# Patient Record
Sex: Female | Born: 1940 | Race: White | Hispanic: No | Smoking: Never smoker
Health system: Southern US, Community
[De-identification: ages and names within clinical notes are randomized; demographics above are authoritative.]

## PROBLEM LIST (undated history)

## (undated) DIAGNOSIS — I10 Essential (primary) hypertension: Secondary | ICD-10-CM

## (undated) HISTORY — PX: HAND SURGERY: SHX662

---

## 1998-02-08 ENCOUNTER — Emergency Department (HOSPITAL_COMMUNITY): Admission: EM | Admit: 1998-02-08 | Discharge: 1998-02-08 | Payer: Self-pay | Admitting: Emergency Medicine

## 1999-12-28 ENCOUNTER — Encounter: Payer: Self-pay | Admitting: Emergency Medicine

## 1999-12-28 ENCOUNTER — Emergency Department (HOSPITAL_COMMUNITY): Admission: EM | Admit: 1999-12-28 | Discharge: 1999-12-28 | Payer: Self-pay | Admitting: Emergency Medicine

## 2005-08-30 ENCOUNTER — Encounter: Payer: Self-pay | Admitting: Emergency Medicine

## 2007-07-13 ENCOUNTER — Emergency Department (HOSPITAL_COMMUNITY): Admission: EM | Admit: 2007-07-13 | Discharge: 2007-07-13 | Payer: Self-pay | Admitting: Emergency Medicine

## 2007-07-26 ENCOUNTER — Encounter: Admission: RE | Admit: 2007-07-26 | Discharge: 2007-07-26 | Payer: Self-pay | Admitting: General Surgery

## 2007-08-20 ENCOUNTER — Encounter: Admission: RE | Admit: 2007-08-20 | Discharge: 2007-08-20 | Payer: Self-pay | Admitting: General Surgery

## 2007-08-23 ENCOUNTER — Emergency Department (HOSPITAL_COMMUNITY): Admission: EM | Admit: 2007-08-23 | Discharge: 2007-08-23 | Payer: Self-pay | Admitting: Emergency Medicine

## 2007-09-03 ENCOUNTER — Encounter: Admission: RE | Admit: 2007-09-03 | Discharge: 2007-09-03 | Payer: Self-pay | Admitting: General Surgery

## 2008-07-02 IMAGING — CR DG HAND COMPLETE 3+V*R*
4 series · 4 of 4 positions shown · non-contrast
Comparison: 07/13/2007, 07/26/2007.

CLINICAL DATA: Fifth metacarpal fracture, follow-up exam

RIGHT HAND - COMPLETE 3+ VIEW

[x hand pa right (1 of 2)]
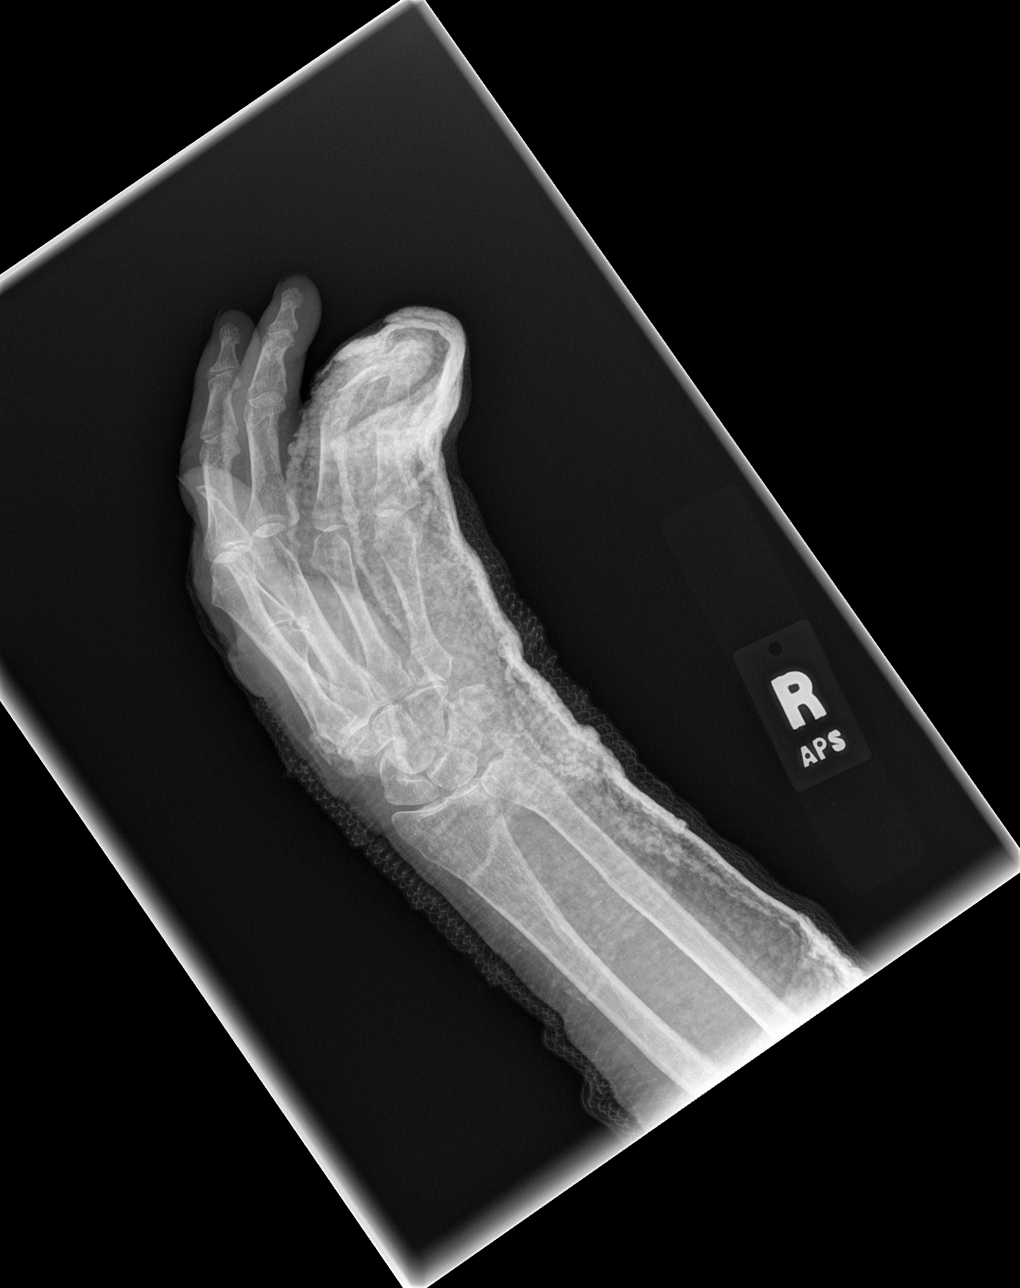

[x hand oblique right]
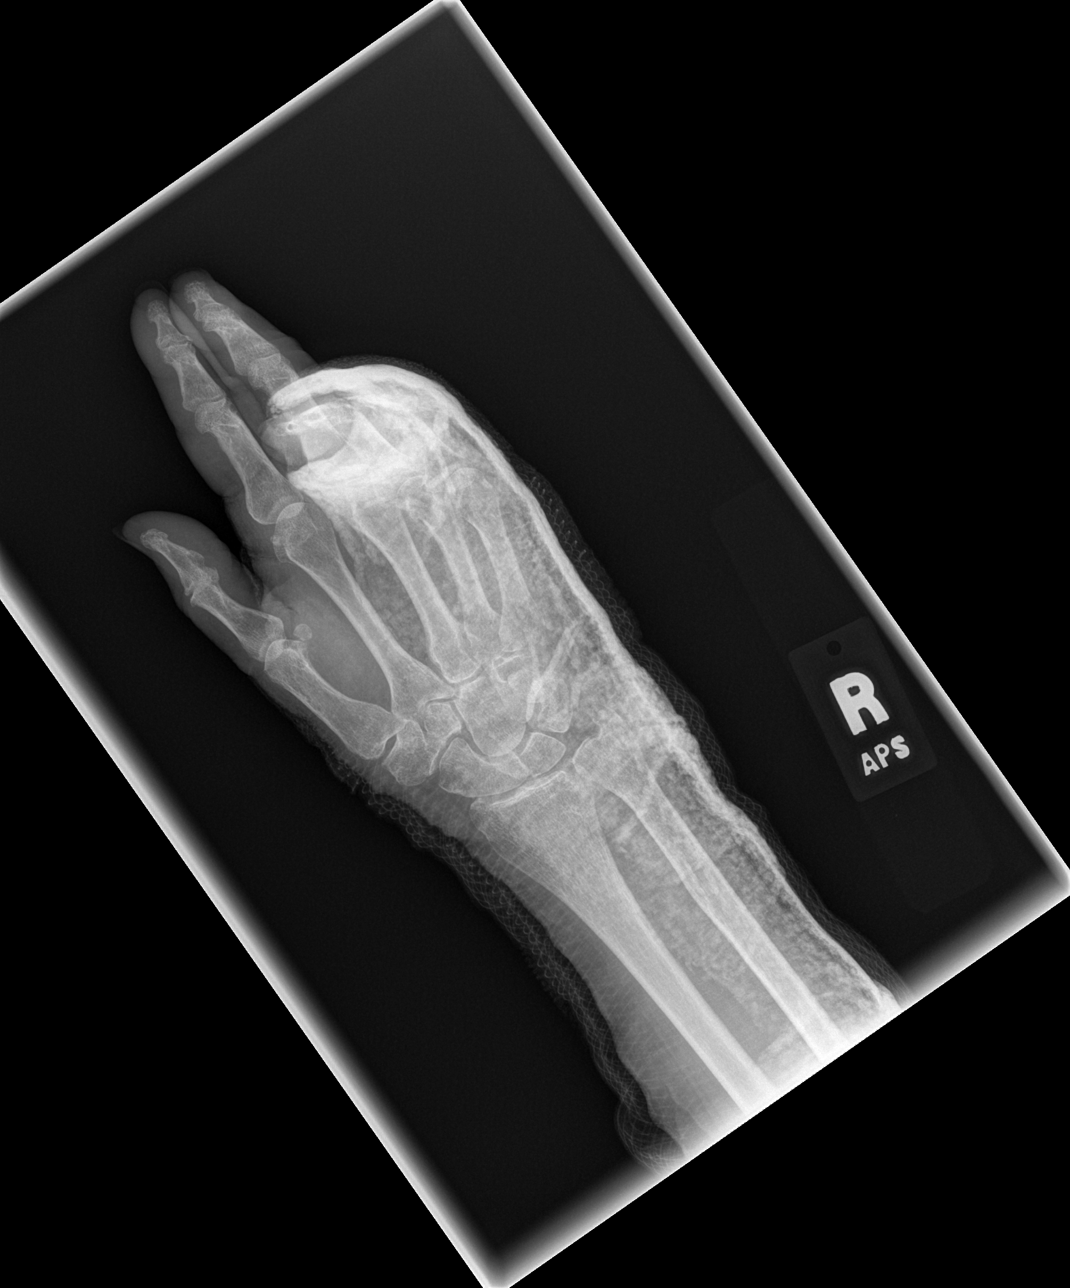

[x hand lat right]
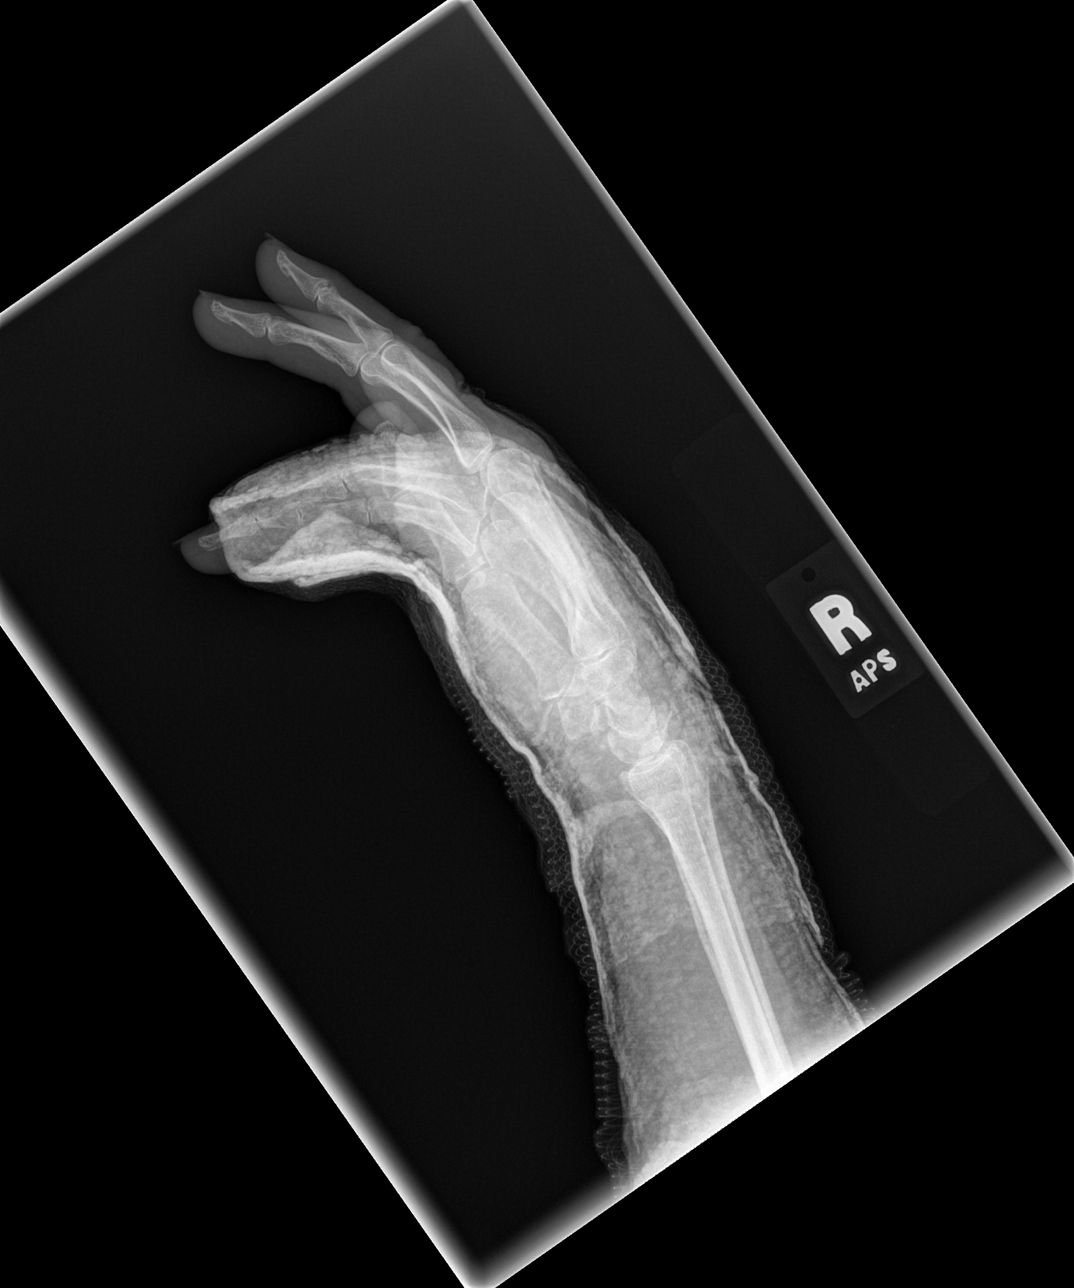

[x hand pa right (2 of 2)]
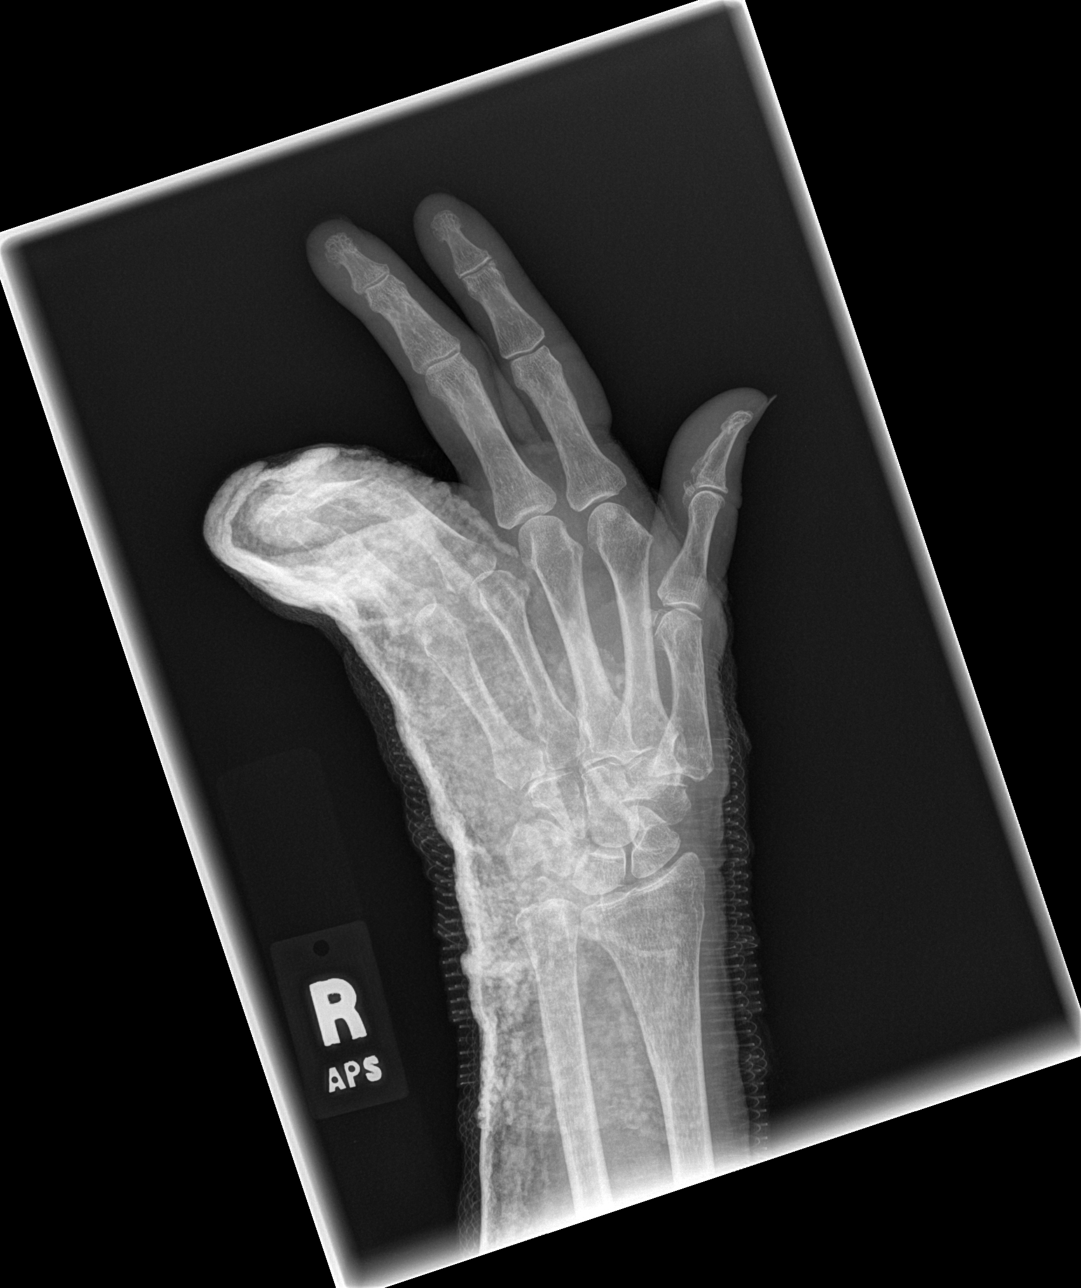

[4 of 4 positions shown; findings below may reference images not displayed]

FINDINGS: Casting material obscures the fine bone detail.  There is
redemonstration of a fourth metacarpal head fracture with slight
impaction.  I suspect there is some minimal bridging callus
formation.  Alignment is overall near anatomic and stable
IMPRESSION: Fourth metacarpal head fracture, with early evidence of callus
formation

## 2011-01-24 LAB — CULTURE, ROUTINE-ABSCESS

## 2018-10-16 ENCOUNTER — Other Ambulatory Visit: Payer: Self-pay

## 2018-10-16 ENCOUNTER — Ambulatory Visit (HOSPITAL_COMMUNITY)
Admission: EM | Admit: 2018-10-16 | Discharge: 2018-10-16 | Disposition: A | Discharge status: Home / Self Care | Payer: Medicare Other | Attending: Family Medicine | Admitting: Family Medicine

## 2018-10-16 ENCOUNTER — Encounter (HOSPITAL_COMMUNITY): Payer: Self-pay | Admitting: Family Medicine

## 2018-10-16 DIAGNOSIS — K5900 Constipation, unspecified: Secondary | ICD-10-CM

## 2018-10-16 MED ORDER — POLYETHYLENE GLYCOL 3350 17 G PO PACK: 17.0000 g | PACK | Freq: Two times a day (BID) | ORAL | 0 refills | Status: DC

## 2018-10-16 NOTE — ED Triage Notes (Signed)
Pt sts constipation x several days; pt appears very anxious

## 2018-10-16 NOTE — ED Provider Notes (Signed)
[place]    CSN: [id number] Arrival date & time: 10/16/18  [place]     History   Chief Complaint Chief Complaint  Patient presents with  . Constipation    HPI Brooke Harrington is a 78 y.o. female.   This is the initial visit for this 78 year old woman who is coming into [facility] urgent care for evaluation of constipation.  Had some fullness with loss of appetite over the last 3 to 4 days.  She tried taking Gatorade but she does not like the taste.  She is passing gas and has had no fever.  Patient denies urinary symptoms.       History reviewed. No pertinent past medical history.  There are no active problems to display for this patient.   History reviewed. No pertinent surgical history.  OB History   No obstetric history on file.      Home Medications    Prior to Admission medications   Medication Sig Start Date End Date Taking? Authorizing Provider  polyethylene glycol (MIRALAX) 17 g packet Take 17 g by mouth 2 (two) times daily. 10/16/18   Robyn Haber, MD    Family History No family history on file.  Social History Social History   Tobacco Use  . Smoking status: Never Smoker  . Smokeless tobacco: Never Used  Substance Use Topics  . Alcohol use: Not Currently  . Drug use: Never     Allergies   Patient has no known allergies.   Review of Systems Review of Systems  Gastrointestinal: Positive for constipation.  All other systems reviewed and are negative.    Physical Exam Triage Vital Signs ED Triage Vitals  Enc Vitals Group     BP      Pulse      Resp      Temp      Temp src      SpO2      Weight      Height      Head Circumference      Peak Flow      Pain Score      Pain Loc      Pain Edu?      Excl. in [place]?    No data found.  Updated Vital Signs BP (!) 191/99 (BP Location: Right Arm)   Pulse (!) 104   Temp 98.7 F (37.1 C) (Oral)   Resp 20   SpO2 95%   Physical Exam Vitals signs and nursing note  reviewed.  Constitutional:      General: She is not in acute distress.    Appearance: Normal appearance. She is obese.  HENT:     Mouth/Throat:     Mouth: Mucous membranes are moist.  Eyes:     Conjunctiva/sclera: Conjunctivae normal.  Neck:     Musculoskeletal: Normal range of motion and neck supple.  Cardiovascular:     Rate and Rhythm: Normal rate.  Pulmonary:     Effort: Pulmonary effort is normal.  Abdominal:     General: Bowel sounds are normal. There is distension.     Palpations: There is no mass.     Tenderness: There is no abdominal tenderness.  Musculoskeletal: Normal range of motion.  Skin:    General: Skin is warm and dry.  Neurological:     General: No focal deficit present.     Mental Status: She is alert and oriented to person, place, and time.     Comments: Patient  has a lip smacking dyskinesia  Psychiatric:        Mood and Affect: Mood normal.        Behavior: Behavior normal.        Thought Content: Thought content normal.        Judgment: Judgment normal.      UC Treatments / Results  Labs (all labs ordered are listed, but only abnormal results are displayed) Labs Reviewed - No data to display  EKG None  Radiology No results found.  Procedures Procedures (including critical care time)  Medications Ordered in UC Medications - No data to display  Initial Impression / Assessment and Plan / UC Course  I have reviewed the triage vital signs and the nursing notes.  Pertinent labs & imaging results that were available during my care of the patient were reviewed by me and considered in my medical decision making (see chart for details).    Final Clinical Impressions(s) / UC Diagnoses   Final diagnoses:  Constipation, unspecified constipation type   Discharge Instructions   None    ED Prescriptions    Medication Sig Dispense Auth. Provider   polyethylene glycol (MIRALAX) 17 g packet Take 17 g by mouth 2 (two) times daily. 14 each  Elvina SidleLauenstein, Delmar Arriaga, MD     Controlled Substance Prescriptions [place] Controlled Substance Registry consulted? Not Applicable   Elvina SidleLauenstein, Jasmond River, MD 10/16/18 [phone]

## 2019-04-10 DIAGNOSIS — H52221 Regular astigmatism, right eye: Secondary | ICD-10-CM | POA: Diagnosis not present

## 2019-04-10 DIAGNOSIS — H25811 Combined forms of age-related cataract, right eye: Secondary | ICD-10-CM | POA: Diagnosis not present

## 2019-05-21 DIAGNOSIS — I10 Essential (primary) hypertension: Secondary | ICD-10-CM | POA: Diagnosis not present

## 2019-05-21 DIAGNOSIS — Z0189 Encounter for other specified special examinations: Secondary | ICD-10-CM | POA: Diagnosis not present

## 2019-05-21 DIAGNOSIS — Z13220 Encounter for screening for lipoid disorders: Secondary | ICD-10-CM | POA: Diagnosis not present

## 2019-07-01 DIAGNOSIS — Z23 Encounter for immunization: Secondary | ICD-10-CM | POA: Diagnosis not present

## 2019-07-01 DIAGNOSIS — R7301 Impaired fasting glucose: Secondary | ICD-10-CM | POA: Diagnosis not present

## 2019-07-01 DIAGNOSIS — E785 Hyperlipidemia, unspecified: Secondary | ICD-10-CM | POA: Diagnosis not present

## 2019-07-01 DIAGNOSIS — I10 Essential (primary) hypertension: Secondary | ICD-10-CM | POA: Diagnosis not present

## 2019-07-18 DIAGNOSIS — Z20828 Contact with and (suspected) exposure to other viral communicable diseases: Secondary | ICD-10-CM | POA: Diagnosis not present

## 2019-08-18 DIAGNOSIS — Z23 Encounter for immunization: Secondary | ICD-10-CM | POA: Diagnosis not present

## 2019-09-09 DIAGNOSIS — I10 Essential (primary) hypertension: Secondary | ICD-10-CM | POA: Diagnosis not present

## 2019-09-09 DIAGNOSIS — E785 Hyperlipidemia, unspecified: Secondary | ICD-10-CM | POA: Diagnosis not present

## 2019-09-09 DIAGNOSIS — F4321 Adjustment disorder with depressed mood: Secondary | ICD-10-CM | POA: Diagnosis not present

## 2019-09-15 DIAGNOSIS — Z23 Encounter for immunization: Secondary | ICD-10-CM | POA: Diagnosis not present

## 2019-11-05 ENCOUNTER — Other Ambulatory Visit: Payer: Self-pay

## 2019-11-05 ENCOUNTER — Emergency Department (HOSPITAL_BASED_OUTPATIENT_CLINIC_OR_DEPARTMENT_OTHER)
Admission: EM | Admit: 2019-11-05 | Discharge: 2019-11-05 | Disposition: A | Discharge status: Home / Self Care | Payer: Medicare Other | Attending: Emergency Medicine | Admitting: Emergency Medicine

## 2019-11-05 ENCOUNTER — Encounter (HOSPITAL_BASED_OUTPATIENT_CLINIC_OR_DEPARTMENT_OTHER): Payer: Self-pay

## 2019-11-05 DIAGNOSIS — R55 Syncope and collapse: Secondary | ICD-10-CM | POA: Diagnosis not present

## 2019-11-05 DIAGNOSIS — R531 Weakness: Secondary | ICD-10-CM | POA: Diagnosis present

## 2019-11-05 DIAGNOSIS — R197 Diarrhea, unspecified: Secondary | ICD-10-CM | POA: Insufficient documentation

## 2019-11-05 DIAGNOSIS — I471 Supraventricular tachycardia: Secondary | ICD-10-CM

## 2019-11-05 DIAGNOSIS — I10 Essential (primary) hypertension: Secondary | ICD-10-CM | POA: Diagnosis not present

## 2019-11-05 DIAGNOSIS — R5383 Other fatigue: Secondary | ICD-10-CM | POA: Insufficient documentation

## 2019-11-05 DIAGNOSIS — R42 Dizziness and giddiness: Secondary | ICD-10-CM | POA: Insufficient documentation

## 2019-11-05 DIAGNOSIS — R000 Tachycardia, unspecified: Secondary | ICD-10-CM | POA: Diagnosis not present

## 2019-11-05 HISTORY — DX: Essential (primary) hypertension: I10

## 2019-11-05 LAB — CBC WITH DIFFERENTIAL/PLATELET
Abs Immature Granulocytes: 0.02 10*3/uL (ref 0.00–0.07)
Basophils Absolute: 0.1 10*3/uL (ref 0.0–0.1)
Basophils Relative: 1 %
Eosinophils Absolute: 0.1 10*3/uL (ref 0.0–0.5)
Eosinophils Relative: 1 %
HCT: 40.8 % (ref 36.0–46.0)
Hemoglobin: 13 g/dL (ref 12.0–15.0)
Immature Granulocytes: 0 %
Lymphocytes Relative: 18 %
Lymphs Abs: 1.3 10*3/uL (ref 0.7–4.0)
MCH: 28.4 pg (ref 26.0–34.0)
MCHC: 31.9 g/dL (ref 30.0–36.0)
MCV: 89.3 fL (ref 80.0–100.0)
Monocytes Absolute: 0.6 10*3/uL (ref 0.1–1.0)
Monocytes Relative: 8 %
Neutro Abs: 5.2 10*3/uL (ref 1.7–7.7)
Neutrophils Relative %: 72 %
Platelets: 299 10*3/uL (ref 150–400)
RBC: 4.57 MIL/uL (ref 3.87–5.11)
RDW: 13.9 % (ref 11.5–15.5)
WBC: 7.2 10*3/uL (ref 4.0–10.5)
nRBC: 0 % (ref 0.0–0.2)

## 2019-11-05 LAB — COMPREHENSIVE METABOLIC PANEL
ALT: 20 U/L (ref 0–44)
AST: 24 U/L (ref 15–41)
Albumin: 3.6 g/dL (ref 3.5–5.0)
Alkaline Phosphatase: 87 U/L (ref 38–126)
Anion gap: 13 (ref 5–15)
BUN: 27 mg/dL — ABNORMAL HIGH (ref 8–23)
CO2: 24 mmol/L (ref 22–32)
Calcium: 9.1 mg/dL (ref 8.9–10.3)
Chloride: 96 mmol/L — ABNORMAL LOW (ref 98–111)
Creatinine, Ser: 1.17 mg/dL — ABNORMAL HIGH (ref 0.44–1.00)
GFR calc Af Amer: 52 mL/min — ABNORMAL LOW (ref >60)
GFR calc non Af Amer: 45 mL/min — ABNORMAL LOW (ref >60)
Glucose, Bld: 175 mg/dL — ABNORMAL HIGH (ref 70–99)
Potassium: 3.6 mmol/L (ref 3.5–5.1)
Sodium: 133 mmol/L — ABNORMAL LOW (ref 135–145)
Total Bilirubin: 0.8 mg/dL (ref 0.3–1.2)
Total Protein: 7.3 g/dL (ref 6.5–8.1)

## 2019-11-05 LAB — MAGNESIUM: Magnesium: 1.9 mg/dL (ref 1.7–2.4)

## 2019-11-05 LAB — TSH: TSH: 4.069 u[IU]/mL (ref 0.350–4.500)

## 2019-11-05 MED ORDER — ADENOSINE 6 MG/2ML IV SOLN: 6.0000 mg | Freq: Once | INTRAVENOUS | Status: AC

## 2019-11-05 MED ORDER — ADENOSINE 6 MG/2ML IV SOLN
INTRAVENOUS | Status: AC
  Administered 2019-11-05: 6 mg via INTRAVENOUS
  Filled 2019-11-05: qty 6

## 2019-11-05 MED ORDER — LOPERAMIDE HCL 2 MG PO CAPS
4.0000 mg | ORAL_CAPSULE | Freq: Once | ORAL | Status: AC
  Administered 2019-11-05: 4 mg via ORAL
  Filled 2019-11-05: qty 2

## 2019-11-05 MED ORDER — SODIUM CHLORIDE 0.9 % IV BOLUS
1000.0000 mL | Freq: Once | INTRAVENOUS | Status: AC
  Administered 2019-11-05: 1000 mL via INTRAVENOUS

## 2019-11-05 NOTE — ED Provider Notes (Signed)
[facility] EMERGENCY DEPARTMENT Provider Note   CSN: [id number] Arrival date & time: 11/05/19  1618     History Chief Complaint  Patient presents with  . Weakness    Brooke Harrington is a 79 y.o. female.  The history is provided by the patient and medical records. No language interpreter was used.  Palpitations Palpitations quality:  Fast Onset quality:  Sudden Duration:  1 hour Timing:  Constant Chronicity:  New Context: dehydration   Relieved by:  Nothing Worsened by:  Nothing Ineffective treatments:  None tried Associated symptoms: malaise/fatigue and near-syncope   Associated symptoms: no back pain, no chest pain, no cough, no diaphoresis, no dizziness, no leg pain, no lower extremity edema, no nausea, no numbness, no shortness of breath, no vomiting and no weakness   Risk factors: no diabetes mellitus, no heart disease, no hx of atrial fibrillation, no hx of DVT, no hx of PE, no hx of thyroid disease, no hyperthyroidism and no OTC sinus medications        Past Medical History:  Diagnosis Date  . Hypertension     There are no problems to display for this patient.   Past Surgical History:  Procedure Laterality Date  . HAND SURGERY       OB History   No obstetric history on file.     No family history on file.  Social History   Tobacco Use  . Smoking status: Never Smoker  . Smokeless tobacco: Never Used  Vaping Use  . Vaping Use: Never used  Substance Use Topics  . Alcohol use: Yes    Comment: occ  . Drug use: Never    Home Medications Prior to Admission medications   Medication Sig Start Date End Date Taking? Authorizing Provider  polyethylene glycol (MIRALAX) 17 g packet Take 17 g by mouth 2 (two) times daily. 10/16/18   Elvina Sidle, MD    Allergies    Patient has no known allergies.  Review of Systems   Review of Systems  Constitutional: Positive for fatigue and malaise/fatigue. Negative for chills, diaphoresis and fever.    HENT: Negative for congestion.   Eyes: Negative for visual disturbance.  Respiratory: Negative for cough, chest tightness, shortness of breath and wheezing.   Cardiovascular: Positive for palpitations and near-syncope. Negative for chest pain and leg swelling.  Gastrointestinal: Negative for abdominal pain, constipation, diarrhea, nausea and vomiting.  Genitourinary: Negative for dysuria, flank pain and frequency.  Musculoskeletal: Negative for back pain, neck pain and neck stiffness.  Skin: Negative for rash and wound.  Neurological: Positive for light-headedness. Negative for dizziness, syncope, facial asymmetry, weakness, numbness and headaches.  Psychiatric/Behavioral: Negative for agitation and confusion.  All other systems reviewed and are negative.   Physical Exam Updated Vital Signs BP (!) 94/50 (BP Location: Left Arm)   Pulse (!) 220   Temp 98.4 F (36.9 C) (Oral)   Resp 20   SpO2 98%   Physical Exam Vitals and nursing note reviewed.  Constitutional:      General: She is not in acute distress.    Appearance: She is well-developed. She is not ill-appearing, toxic-appearing or diaphoretic.  HENT:     Head: Normocephalic and atraumatic.     Nose: No congestion or rhinorrhea.     Mouth/Throat:     Mouth: Mucous membranes are moist.     Pharynx: No oropharyngeal exudate or posterior oropharyngeal erythema.  Eyes:     Extraocular Movements: Extraocular movements intact.  Conjunctiva/sclera: Conjunctivae normal.     Pupils: Pupils are equal, round, and reactive to light.  Cardiovascular:     Rate and Rhythm: Regular rhythm. Tachycardia present.     Pulses: Normal pulses.     Heart sounds: No murmur heard.   Pulmonary:     Effort: Pulmonary effort is normal. No respiratory distress.     Breath sounds: Normal breath sounds. No wheezing, rhonchi or rales.  Chest:     Chest wall: No tenderness.  Abdominal:     Palpations: Abdomen is soft.     Tenderness: There is  no abdominal tenderness. There is no right CVA tenderness, left CVA tenderness, guarding or rebound.  Musculoskeletal:        General: No tenderness.     Cervical back: Neck supple. No tenderness.  Skin:    General: Skin is warm and dry.     Capillary Refill: Capillary refill takes less than 2 seconds.     Findings: No erythema.  Neurological:     General: No focal deficit present.     Mental Status: She is alert and oriented to person, place, and time.  Psychiatric:        Mood and Affect: Mood normal.     ED Results / Procedures / Treatments   Labs (all labs ordered are listed, but only abnormal results are displayed) Labs Reviewed  COMPREHENSIVE METABOLIC PANEL - Abnormal; Notable for the following components:      Result Value   Sodium 133 (*)    Chloride 96 (*)    Glucose, Bld 175 (*)    BUN 27 (*)    Creatinine, Ser 1.17 (*)    GFR calc non Af Amer 45 (*)    GFR calc Af Amer 52 (*)    All other components within normal limits  CBC WITH DIFFERENTIAL/PLATELET  MAGNESIUM  TSH    EKG EKG Interpretation  Date/Time:  Wednesday November 05 2019 16:53:32 EDT Ventricular Rate:  87 PR Interval:    QRS Duration: 94 QT Interval:  350 QTC Calculation: 421 R Axis:   59 Text Interpretation: Sinus rhythm Abnormal R-wave progression, early transition Nonspecific ST depression, anterior leads When compared to ECG earlier, now sinus from SVT. NO STEMI Confirmed by Theda Belfastegeler, Chris ([id number]) on 11/05/2019 4:58:49 PM   EKG Interpretation  Date/Time:  Wednesday November 05 2019 16:32:29 EDT Ventricular Rate:  223 PR Interval:    QRS Duration: 82 QT Interval:  210 QTC Calculation: 404 R Axis:   54 Text Interpretation: Supraventricular tachycardia Marked ST abnormality, possible inferior subendocardial injury Marked ST abnormality, possible anterolateral subendocardial injury Abnormal ECG No prior ECG for comparison Appears to show SVT. No STEMI Confirmed by Theda Belfastegeler, Chris ([id number]) on  11/05/2019 4:47:02 PM   Radiology No results found.  Procedures .Cardioversion  Date/Time: 11/05/2019 5:10 PM Performed by: Heide Scalesegeler, Sharon Rubis J, MD Authorized by: Heide Scalesegeler, Farah Lepak J, MD   Consent:    Consent obtained:  Verbal and emergent situation   Consent given by:  Patient   Risks discussed:  Induced arrhythmia and pain   Alternatives discussed:  No treatment Pre-procedure details:    Cardioversion basis:  Emergent   Rhythm:  Supraventricular tachycardia Patient sedated: No Attempt one:    Cardioversion mode attempt one: adenosine. Post-procedure details:    Patient status:  Awake   Patient tolerance of procedure:  Tolerated well, no immediate complications Comments:     Chemical cardioversion with Adenosine.      (including critical  care time)  CRITICAL CARE Performed by: Canary Brim Barba Solt Total critical care time: 35 minutes Critical care time was exclusive of separately billable procedures and treating other patients. Critical care was necessary to treat or prevent imminent or life-threatening deterioration. Critical care was time spent personally by me on the following activities: development of treatment plan with patient and/or surrogate as well as nursing, discussions with consultants, evaluation of patient's response to treatment, examination of patient, obtaining history from patient or surrogate, ordering and performing treatments and interventions, ordering and review of laboratory studies, ordering and review of radiographic studies, pulse oximetry and re-evaluation of patient's condition.    Medications Ordered in ED Medications  sodium chloride 0.9 % bolus 1,000 mL (0 mLs Intravenous Stopped 11/05/19 1757)  adenosine (ADENOCARD) 6 MG/2ML injection 6 mg (6 mg Intravenous Given 11/05/19 1645)  loperamide (IMODIUM) capsule 4 mg (4 mg Oral Given 11/05/19 2133)    ED Course  I have reviewed the triage vital signs and the nursing notes.  Pertinent  labs & imaging results that were available during my care of the patient were reviewed by me and considered in my medical decision making (see chart for details).    MDM Rules/Calculators/A&P                          BRANDYE INTHAVONG is a 79 y.o. female with a past medical history significant for hypertension who presents with severe fatigue and lightheadedness with some palpitations.  She reports that today, she was sitting in her car outside of [employer] within the last hour or 2 when she started having lightheadedness and fatigue.  She is never felt like this before.  She reports that "I do not do well in the heat" and thought she was dehydrated.  She reports no other preceding symptoms.  She denies any current chest pain or shortness of breath but does report some palpitations.  She denies any drug use and denies any other problems.  She denies any recent medication changes.  She denies recent nausea, vomiting, constipation, or diarrhea.  No recent urinary symptoms.  Patient just feels fatigued at this time.  On exam, lungs are clear and chest is nontender.  No murmur.  Abdomen nontender.  Good pulses in all extremities.  Patient is extremely tachycardic and her initial EKG shows SVT with a rate in the 220s to 230 range.  She is feeling palpitations now.  Blood pressure is soft in the 80s but patient is mentating well.  Exam otherwise unremarkable.  We tried vagal maneuvers after EKG confirmed the SVT.  There is no evidence of A. fib and patient is no history of A. fib.  SVT maneuver was unsuccessful and we discussed emergent adenosine use for chemical cardioversion of the SVT.  Adenosine was used and patient was quickly cardioverted without difficulty to a sinus rhythm.  Patient feels much better and her symptoms have resolved.  Her blood pressure improved to 111 systolic and she is feeling much better.  We will get screening labs and give her some fluids.  Anticipate monitoring her to make sure  symptoms do not return however patient feels well and remains in sinus rhythm, anticipate discharge home with PCP/cardiology follow-up.  Patient is in agreement with this plan.     Patient had some diarrhea and was given loperamide.  Her diarrhea improved.  She is feeling better.  She was observed for over 6 hours with no episodes  of SVT recurrence.  She is feeling well and would like to go home.  Patient was able to tolerate p.o. and we feel safe with her maintaining hydration at home.  Suspect component of dehydration with the diarrhea contributed to this in the heat outside.  Patient agrees with plan of care and PCP follow-up.  She had no other questions or concerns and was discharged in good condition.   Final Clinical Impression(s) / ED Diagnoses Final diagnoses:  SVT (supraventricular tachycardia) (HCC)  Fatigue, unspecified type  Lightheadedness  Diarrhea, unspecified type    Rx / DC Orders ED Discharge Orders    None      Clinical Impression: 1. SVT (supraventricular tachycardia) (HCC)   2. Fatigue, unspecified type   3. Lightheadedness   4. Diarrhea, unspecified type     Disposition: Discharge  Condition: Good  I have discussed the results, Dx and Tx plan with the pt(& family if present). He/she/they expressed understanding and agree(s) with the plan. Discharge instructions discussed at great length. Strict return precautions discussed and pt &/or family have verbalized understanding of the instructions. No further questions at time of discharge.    Discharge Medication List as of 11/05/2019 11:05 PM      Follow Up: [facility] [place] [facility] [address] [id number] mc [address] [place] [address] [phone]    [facility] CARDIOVASCULAR DIVISION [address] [place] [address] [phone]       Reana Chacko, Canary Brim, MD 11/05/19 [phone]

## 2019-11-05 NOTE — ED Notes (Signed)
Pt converted from svt to sr.
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
## 2019-11-05 NOTE — Discharge Instructions (Addendum)
Your EKG on arrival is concerning for an arrhythmia called SVT.  We were able to convert you back to a sinus rhythm with medication, adenosine, and then you maintain the normal rhythm for over 6 hours of observation.  You were having episodes of diarrhea however your laboratory testing was overall reassuring.  You were able to tolerate eating and drinking and otherwise you were feeling well for the rest of your ED stay.  At this time, we did not feel that you needed admission however, if you start feeling the palpitations, lightheadedness, or feel the fast heart rate again, please return to the nearest emergency department.  Please rest and stay hydrated.  If any symptoms change or worsen, please return to the nearest emergency department.  Please follow-up with your PCP and a cardiologist.

## 2020-04-04 ENCOUNTER — Encounter (HOSPITAL_COMMUNITY): Payer: Self-pay | Admitting: Emergency Medicine

## 2020-04-04 ENCOUNTER — Other Ambulatory Visit: Payer: Self-pay

## 2020-04-04 ENCOUNTER — Emergency Department (HOSPITAL_COMMUNITY)
Admission: EM | Admit: 2020-04-04 | Discharge: 2020-04-04 | Disposition: A | Discharge status: Home / Self Care | Payer: Medicare Other | Attending: Emergency Medicine | Admitting: Emergency Medicine

## 2020-04-04 DIAGNOSIS — S0101XA Laceration without foreign body of scalp, initial encounter: Secondary | ICD-10-CM | POA: Insufficient documentation

## 2020-04-04 DIAGNOSIS — W19XXXA Unspecified fall, initial encounter: Secondary | ICD-10-CM | POA: Diagnosis not present

## 2020-04-04 DIAGNOSIS — Z23 Encounter for immunization: Secondary | ICD-10-CM | POA: Diagnosis not present

## 2020-04-04 DIAGNOSIS — Y92512 Supermarket, store or market as the place of occurrence of the external cause: Secondary | ICD-10-CM | POA: Insufficient documentation

## 2020-04-04 DIAGNOSIS — Z79899 Other long term (current) drug therapy: Secondary | ICD-10-CM | POA: Insufficient documentation

## 2020-04-04 DIAGNOSIS — R52 Pain, unspecified: Secondary | ICD-10-CM | POA: Diagnosis not present

## 2020-04-04 DIAGNOSIS — W500XXA Accidental hit or strike by another person, initial encounter: Secondary | ICD-10-CM | POA: Insufficient documentation

## 2020-04-04 DIAGNOSIS — I10 Essential (primary) hypertension: Secondary | ICD-10-CM | POA: Insufficient documentation

## 2020-04-04 DIAGNOSIS — S0990XA Unspecified injury of head, initial encounter: Secondary | ICD-10-CM | POA: Diagnosis not present

## 2020-04-04 MED ORDER — TETANUS-DIPHTH-ACELL PERTUSSIS 5-2.5-18.5 LF-MCG/0.5 IM SUSY
0.5000 mL | PREFILLED_SYRINGE | Freq: Once | INTRAMUSCULAR | Status: AC
  Administered 2020-04-04: 0.5 mL via INTRAMUSCULAR
  Filled 2020-04-04: qty 0.5

## 2020-04-04 MED ORDER — LIDOCAINE-EPINEPHRINE 1 %-1:100000 IJ SOLN
10.0000 mL | Freq: Once | INTRAMUSCULAR | Status: AC
  Administered 2020-04-04: 10 mL
  Filled 2020-04-04: qty 1
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
## 2020-04-16 ENCOUNTER — Emergency Department (HOSPITAL_COMMUNITY)
Admission: EM | Admit: 2020-04-16 | Discharge: 2020-04-16 | Disposition: A | Discharge status: Home / Self Care | Payer: Medicare Other | Attending: Emergency Medicine | Admitting: Emergency Medicine

## 2020-04-16 DIAGNOSIS — Z79899 Other long term (current) drug therapy: Secondary | ICD-10-CM | POA: Insufficient documentation

## 2020-04-16 DIAGNOSIS — I10 Essential (primary) hypertension: Secondary | ICD-10-CM | POA: Diagnosis not present

## 2020-04-16 DIAGNOSIS — Z4802 Encounter for removal of sutures: Secondary | ICD-10-CM | POA: Insufficient documentation

## 2020-04-16 NOTE — ED Provider Notes (Signed)
[facility] EMERGENCY DEPARTMENT Provider Note   CSN: [id number] Arrival date & time: 04/16/20  1108     History Chief Complaint  Patient presents with  . Suture / Staple Removal    Brooke Harrington is a 79 y.o. female.  The history is provided by the patient. No language interpreter was used.  Suture / Staple Removal This is a new problem. The current episode started more than 1 week ago. The problem has been gradually improving. Pertinent negatives include no headaches. Nothing aggravates the symptoms. Nothing relieves the symptoms. She has tried nothing for the symptoms. The treatment provided no relief.   Pt is here for staple removal.      Past Medical History:  Diagnosis Date  . Hypertension     There are no problems to display for this patient.   Past Surgical History:  Procedure Laterality Date  . HAND SURGERY       OB History   No obstetric history on file.     No family history on file.  Social History   Tobacco Use  . Smoking status: Never Smoker  . Smokeless tobacco: Never Used  Vaping Use  . Vaping Use: Never used  Substance Use Topics  . Alcohol use: Yes    Comment: occ  . Drug use: Never    Home Medications Prior to Admission medications   Medication Sig Start Date End Date Taking? Authorizing Provider  lisinopril-hydrochlorothiazide (ZESTORETIC) 20-25 MG tablet Take 1 tablet by mouth daily. 09/09/19   [provider]  metoprolol succinate (TOPROL-XL) 25 MG 24 hr tablet Take 25 mg by mouth daily. 09/09/19   [provider]  polyethylene glycol (MIRALAX) 17 g packet Take 17 g by mouth 2 (two) times daily. 10/16/18   Elvina Sidle, MD  rosuvastatin (CRESTOR) 10 MG tablet Take by mouth as directed. 09/09/19   [provider]    Allergies    Patient has no known allergies.  Review of Systems   Review of Systems  Neurological: Negative for headaches.  All other systems reviewed and are  negative.   Physical Exam Updated Vital Signs BP (!) 175/71   Pulse 89   Temp 98.8 F (37.1 C) (Oral)   Resp 16   SpO2 100%   Physical Exam Vitals reviewed.  Skin:    General: Skin is warm.     Comments: 5 staples scalp  Healing wound  Neurological:     General: No focal deficit present.     Mental Status: She is alert.  Psychiatric:        Mood and Affect: Mood normal.     ED Results / Procedures / Treatments   Labs (all labs ordered are listed, but only abnormal results are displayed) Labs Reviewed - No data to display  EKG None  Radiology No results found.  Procedures Procedures (including critical care time)  Medications Ordered in ED Medications - No data to display  ED Course  I have reviewed the triage vital signs and the nursing notes.  Pertinent labs & imaging results that were available during my care of the patient were reviewed by me and considered in my medical decision making (see chart for details).    MDM Rules/Calculators/A&P                          Staples removed.  Final Clinical Impression(s) / ED Diagnoses Final diagnoses:  Encounter for staple removal  Rx / DC Orders ED Discharge Orders    None       Osie Cheeks 04/16/20 1536    Virgina Norfolk, DO 04/16/20 1549

## 2020-04-16 NOTE — ED Notes (Signed)
5 staples removed from right occipital region of pt's head, edges well approximated and healed. Pt tolerated well.

## 2020-04-16 NOTE — ED Triage Notes (Signed)
Pt here for staple removal. 5 staples noted to posterior head. Pt states was due to have them removed on Sunday.

## 2021-04-23 ENCOUNTER — Emergency Department (HOSPITAL_BASED_OUTPATIENT_CLINIC_OR_DEPARTMENT_OTHER): Payer: Medicare Other

## 2021-04-23 ENCOUNTER — Other Ambulatory Visit: Payer: Self-pay

## 2021-04-23 ENCOUNTER — Inpatient Hospital Stay (HOSPITAL_BASED_OUTPATIENT_CLINIC_OR_DEPARTMENT_OTHER)
Admission: EM | Admit: 2021-04-23 | Discharge: 2021-04-29 | DRG: 871 | Disposition: A | Discharge status: Rehabilitation Facility | Payer: Medicare Other | Attending: Internal Medicine | Admitting: Internal Medicine

## 2021-04-23 ENCOUNTER — Encounter (HOSPITAL_BASED_OUTPATIENT_CLINIC_OR_DEPARTMENT_OTHER): Payer: Self-pay | Admitting: *Deleted

## 2021-04-23 DIAGNOSIS — E669 Obesity, unspecified: Secondary | ICD-10-CM | POA: Diagnosis present

## 2021-04-23 DIAGNOSIS — A413 Sepsis due to Hemophilus influenzae: Principal | ICD-10-CM | POA: Diagnosis present

## 2021-04-23 DIAGNOSIS — I10 Essential (primary) hypertension: Secondary | ICD-10-CM | POA: Diagnosis present

## 2021-04-23 DIAGNOSIS — L8990 Pressure ulcer of unspecified site, unspecified stage: Secondary | ICD-10-CM | POA: Insufficient documentation

## 2021-04-23 DIAGNOSIS — Z20822 Contact with and (suspected) exposure to covid-19: Secondary | ICD-10-CM | POA: Diagnosis present

## 2021-04-23 DIAGNOSIS — J189 Pneumonia, unspecified organism: Secondary | ICD-10-CM | POA: Diagnosis present

## 2021-04-23 DIAGNOSIS — K5900 Constipation, unspecified: Secondary | ICD-10-CM | POA: Diagnosis present

## 2021-04-23 DIAGNOSIS — E861 Hypovolemia: Secondary | ICD-10-CM | POA: Diagnosis present

## 2021-04-23 DIAGNOSIS — E785 Hyperlipidemia, unspecified: Secondary | ICD-10-CM | POA: Diagnosis present

## 2021-04-23 DIAGNOSIS — J101 Influenza due to other identified influenza virus with other respiratory manifestations: Secondary | ICD-10-CM | POA: Diagnosis present

## 2021-04-23 DIAGNOSIS — R0602 Shortness of breath: Secondary | ICD-10-CM

## 2021-04-23 DIAGNOSIS — E876 Hypokalemia: Secondary | ICD-10-CM | POA: Diagnosis present

## 2021-04-23 DIAGNOSIS — D649 Anemia, unspecified: Secondary | ICD-10-CM | POA: Diagnosis present

## 2021-04-23 DIAGNOSIS — E440 Moderate protein-calorie malnutrition: Secondary | ICD-10-CM | POA: Insufficient documentation

## 2021-04-23 DIAGNOSIS — Z6832 Body mass index (BMI) 32.0-32.9, adult: Secondary | ICD-10-CM

## 2021-04-23 DIAGNOSIS — Z79899 Other long term (current) drug therapy: Secondary | ICD-10-CM

## 2021-04-23 DIAGNOSIS — I248 Other forms of acute ischemic heart disease: Secondary | ICD-10-CM | POA: Diagnosis present

## 2021-04-23 DIAGNOSIS — E871 Hypo-osmolality and hyponatremia: Secondary | ICD-10-CM | POA: Diagnosis present

## 2021-04-23 DIAGNOSIS — J9601 Acute respiratory failure with hypoxia: Secondary | ICD-10-CM | POA: Diagnosis present

## 2021-04-23 DIAGNOSIS — I471 Supraventricular tachycardia: Secondary | ICD-10-CM | POA: Diagnosis present

## 2021-04-23 DIAGNOSIS — I4729 Other ventricular tachycardia: Secondary | ICD-10-CM

## 2021-04-23 DIAGNOSIS — G9341 Metabolic encephalopathy: Secondary | ICD-10-CM | POA: Diagnosis present

## 2021-04-23 DIAGNOSIS — E8809 Other disorders of plasma-protein metabolism, not elsewhere classified: Secondary | ICD-10-CM | POA: Diagnosis present

## 2021-04-23 DIAGNOSIS — J1001 Influenza due to other identified influenza virus with the same other identified influenza virus pneumonia: Secondary | ICD-10-CM | POA: Diagnosis present

## 2021-04-23 DIAGNOSIS — R4781 Slurred speech: Secondary | ICD-10-CM | POA: Diagnosis present

## 2021-04-23 DIAGNOSIS — I472 Ventricular tachycardia, unspecified: Secondary | ICD-10-CM | POA: Diagnosis present

## 2021-04-23 LAB — CBC WITH DIFFERENTIAL/PLATELET
Abs Immature Granulocytes: 0.28 10*3/uL — ABNORMAL HIGH (ref 0.00–0.07)
Basophils Absolute: 0 10*3/uL (ref 0.0–0.1)
Basophils Relative: 0 %
Eosinophils Absolute: 0 10*3/uL (ref 0.0–0.5)
Eosinophils Relative: 0 %
HCT: 38.5 % (ref 36.0–46.0)
Hemoglobin: 12.6 g/dL (ref 12.0–15.0)
Immature Granulocytes: 2 %
Lymphocytes Relative: 7 %
Lymphs Abs: 0.9 10*3/uL (ref 0.7–4.0)
MCH: 27.9 pg (ref 26.0–34.0)
MCHC: 32.7 g/dL (ref 30.0–36.0)
MCV: 85.2 fL (ref 80.0–100.0)
Monocytes Absolute: 0.3 10*3/uL (ref 0.1–1.0)
Monocytes Relative: 2 %
Neutro Abs: 10.6 10*3/uL — ABNORMAL HIGH (ref 1.7–7.7)
Neutrophils Relative %: 89 %
Platelets: 278 10*3/uL (ref 150–400)
RBC: 4.52 MIL/uL (ref 3.87–5.11)
RDW: 13.9 % (ref 11.5–15.5)
Smear Review: NORMAL
WBC: 12 10*3/uL — ABNORMAL HIGH (ref 4.0–10.5)
nRBC: 0.3 % — ABNORMAL HIGH (ref 0.0–0.2)

## 2021-04-23 LAB — COMPREHENSIVE METABOLIC PANEL
ALT: 30 U/L (ref 0–44)
AST: 39 U/L (ref 15–41)
Albumin: 2.3 g/dL — ABNORMAL LOW (ref 3.5–5.0)
Alkaline Phosphatase: 73 U/L (ref 38–126)
Anion gap: 13 (ref 5–15)
BUN: 54 mg/dL — ABNORMAL HIGH (ref 8–23)
CO2: 25 mmol/L (ref 22–32)
Calcium: 8.2 mg/dL — ABNORMAL LOW (ref 8.9–10.3)
Chloride: 94 mmol/L — ABNORMAL LOW (ref 98–111)
Creatinine, Ser: 1.04 mg/dL — ABNORMAL HIGH (ref 0.44–1.00)
GFR, Estimated: 54 mL/min — ABNORMAL LOW (ref >60)
Glucose, Bld: 125 mg/dL — ABNORMAL HIGH (ref 70–99)
Potassium: 3 mmol/L — ABNORMAL LOW (ref 3.5–5.1)
Sodium: 132 mmol/L — ABNORMAL LOW (ref 135–145)
Total Bilirubin: 0.8 mg/dL (ref 0.3–1.2)
Total Protein: 6.6 g/dL (ref 6.5–8.1)

## 2021-04-23 LAB — TROPONIN I (HIGH SENSITIVITY)
Troponin I (High Sensitivity): 452 ng/L (ref <18)
Troponin I (High Sensitivity): 416 ng/L (ref <18)

## 2021-04-23 LAB — RESP PANEL BY RT-PCR (FLU A&B, COVID) ARPGX2
Influenza A by PCR: POSITIVE — AB
Influenza B by PCR: NEGATIVE
SARS Coronavirus 2 by RT PCR: NEGATIVE

## 2021-04-23 LAB — LACTIC ACID, PLASMA
Lactic Acid, Venous: 1.9 mmol/L (ref 0.5–1.9)
Lactic Acid, Venous: 1.7 mmol/L (ref 0.5–1.9)

## 2021-04-23 LAB — BRAIN NATRIURETIC PEPTIDE: B Natriuretic Peptide: 369.2 pg/mL — ABNORMAL HIGH (ref 0.0–100.0)

## 2021-04-23 LAB — MAGNESIUM: Magnesium: 1.9 mg/dL (ref 1.7–2.4)

## 2021-04-23 MED ORDER — LACTATED RINGERS IV BOLUS
1000.0000 mL | Freq: Once | INTRAVENOUS | Status: AC
  Administered 2021-04-23: 19:00:00 1000 mL via INTRAVENOUS

## 2021-04-23 MED ORDER — VANCOMYCIN HCL 500 MG/100ML IV SOLN
500.0000 mg | INTRAVENOUS | Status: DC
  Filled 2021-04-23: qty 100

## 2021-04-23 MED ORDER — POTASSIUM CHLORIDE CRYS ER 20 MEQ PO TBCR
40.0000 meq | EXTENDED_RELEASE_TABLET | Freq: Once | ORAL | Status: AC
  Administered 2021-04-23: 22:00:00 40 meq via ORAL
  Filled 2021-04-23: qty 2

## 2021-04-23 MED ORDER — VANCOMYCIN HCL IN DEXTROSE 1-5 GM/200ML-% IV SOLN
1000.0000 mg | Freq: Once | INTRAVENOUS | Status: AC
  Administered 2021-04-24: 01:00:00 1000 mg via INTRAVENOUS
  Filled 2021-04-23: qty 200

## 2021-04-23 MED ORDER — SODIUM CHLORIDE 0.9 % IV SOLN
2.0000 g | Freq: Two times a day (BID) | INTRAVENOUS | Status: DC
  Administered 2021-04-23: 22:00:00 2 g via INTRAVENOUS
  Filled 2021-04-23: qty 2

## 2021-04-23 MED ORDER — IPRATROPIUM-ALBUTEROL 0.5-2.5 (3) MG/3ML IN SOLN: 3.0000 mL | Freq: Once | RESPIRATORY_TRACT | Status: DC

## 2021-04-23 MED ORDER — OSELTAMIVIR PHOSPHATE 75 MG PO CAPS
75.0000 mg | ORAL_CAPSULE | Freq: Once | ORAL | Status: AC
  Administered 2021-04-23: 22:00:00 75 mg via ORAL
  Filled 2021-04-23: qty 1

## 2021-04-23 MED ORDER — POTASSIUM CHLORIDE 10 MEQ/100ML IV SOLN
10.0000 meq | INTRAVENOUS | Status: AC
  Administered 2021-04-23 (×3): 10 meq via INTRAVENOUS
  Filled 2021-04-23 (×4): qty 100

## 2021-04-23 NOTE — ED Triage Notes (Signed)
Weakness, incoherent at time,  states slurring word  2 days ago, cough, congestion onset 6 days ago

## 2021-04-23 NOTE — Progress Notes (Signed)
Pharmacy Antibiotic Note  Brooke Harrington is a 80 y.o. female admitted on 04/23/2021 with pneumonia.  Pharmacy has been consulted for cefepime and vancomycin dosing.  WBC elevated. SCr mildly elevated.   Plan: -Cefepime 2 gm IV Q 12 hours -Vancomycin 1 gm now followed by Vancomycin 500 mg IV Q 24 hrs. Goal AUC 400-550. Expected AUC: 527 SCr used: 1.04 -Monitor CBC, renal fx, cultures and clinical progress -Vanc levels as indicated    Height: 5' (152.4 cm) IBW/kg (Calculated) : 45.5  Temp (24hrs), Avg:98.7 F (37.1 C), Min:98.7 F (37.1 C), Max:98.7 F (37.1 C)  Recent Labs  Lab 04/23/21 1852 04/23/21 2036  WBC 12.0*  --   CREATININE 1.04*  --   LATICACIDVEN 1.9 1.7    CrCl cannot be calculated (Unknown ideal weight.).    No Known Allergies  Antimicrobials this admission: Cefepime 12/24 >>  Vancomycin 12/24 >>   Dose adjustments this admission:   Microbiology results: 12/24 BCx:    Thank you for allowing pharmacy to be a part of this patients care.  Vinnie Level, PharmD., BCPS, BCCCP Clinical Pharmacist Please refer to [facility] for unit-specific pharmacist

## 2021-04-23 NOTE — Plan of Care (Addendum)
[facility] transfer note:  80 yo F with influenza A, PNA, trop elevation.  No CP.  H/o HTN  Non ambulatory over past week.  Got ABx and tamiflu.  Frequent episodes of SVT in ED vs A.Fib vs NSVT.  Sending to [facility].  No O2 requirement.  Apparently tachycardia runs improved in ED with Oxygen.  EDP spoke with cards.  [facility] will assume care on arrival to accepting facility. Until arrival, care as per EDP. However, [facility] available 24/7 for questions and assistance.  Nursing staff, please page [facility] Admits and Consults [phone]) as soon as the patient arrives the hospital.

## 2021-04-23 NOTE — ED Notes (Signed)
Patient transported to CT 

## 2021-04-23 NOTE — ED Provider Notes (Signed)
[place] EMERGENCY DEPARTMENT Provider Note   CSN: [id number] Arrival date & time: 04/23/21  1711     History Chief Complaint  Patient presents with   Weakness    Brooke Harrington is a 80 y.o. female.  HPI     Flu like symptoms since Monday Severe fatigue, cough, not productive cough, mild runny nose, sore throat,  Shortness of breath per daughter in law, has been a bit incoherent/difficult to understand, the other day was slurring her words just that one day, then improved Phlegm in throat, fatigue, difficult to understand Seemed like she was getting better tyhan got worse Low grade temperature 99.5 at home No nausea, vomiting, diarrhea No chest pain Dizzy/lightheaded, coming and going Don't think having rapid HR or palpitations Trouble walking for a few years but cannot walk over the last week    Past Medical History:  Diagnosis Date   Hypertension     Patient Active Problem List   Diagnosis Date Noted   Influenza A 04/23/2021    Past Surgical History:  Procedure Laterality Date   HAND SURGERY       OB History   No obstetric history on file.     No family history on file.  Social History   Tobacco Use   Smoking status: Never   Smokeless tobacco: Never  Vaping Use   Vaping Use: Never used  Substance Use Topics   Alcohol use: Yes    Comment: occ   Drug use: Never    Home Medications Prior to Admission medications   Medication Sig Start Date End Date Taking? Authorizing Provider  lisinopril-hydrochlorothiazide (ZESTORETIC) 20-25 MG tablet Take 1 tablet by mouth daily. 09/09/19   [provider]  metoprolol succinate (TOPROL-XL) 25 MG 24 hr tablet Take 25 mg by mouth daily. 09/09/19   [provider]  polyethylene glycol (MIRALAX) 17 g packet Take 17 g by mouth 2 (two) times daily. 10/16/18   Robyn Haber, MD  rosuvastatin (CRESTOR) 10 MG tablet Take by mouth as directed. 09/09/19   [provider]     Allergies    Patient has no known allergies.  Review of Systems   Review of Systems  Constitutional:  Positive for appetite change and fatigue. Negative for fever.  HENT:  Positive for congestion and sore throat.   Eyes:  Negative for visual disturbance.  Respiratory:  Positive for cough and shortness of breath.   Cardiovascular:  Negative for chest pain and leg swelling.  Gastrointestinal:  Negative for abdominal pain, diarrhea, nausea and vomiting.  Genitourinary:  Negative for difficulty urinating.  Musculoskeletal:  Negative for back pain and neck pain.  Skin:  Negative for rash.  Neurological:  Positive for dizziness, light-headedness and headaches. Negative for syncope.   Physical Exam Updated Vital Signs BP (!) 150/111 (BP Location: Left Wrist)    Pulse 97    Temp 98.9 F (37.2 C) (Oral)    Resp (!) 25    Ht 5' (1.524 m)    SpO2 98%    BMI 27.34 kg/m   Physical Exam Vitals and nursing note reviewed.  Constitutional:      General: She is not in acute distress.    Appearance: She is well-developed. She is not diaphoretic.  HENT:     Head: Normocephalic and atraumatic.  Eyes:     Conjunctiva/sclera: Conjunctivae normal.  Cardiovascular:     Rate and Rhythm: Normal rate and regular rhythm.     Heart sounds:  Normal heart sounds. No murmur heard.   No friction rub. No gallop.  Pulmonary:     Effort: Pulmonary effort is normal. No respiratory distress.     Breath sounds: Normal breath sounds. No wheezing or rales.  Abdominal:     General: There is no distension.     Palpations: Abdomen is soft.     Tenderness: There is no abdominal tenderness. There is no guarding.  Musculoskeletal:        General: No tenderness.     Cervical back: Normal range of motion.  Skin:    General: Skin is warm and dry.     Findings: No erythema or rash.  Neurological:     Mental Status: She is alert and oriented to person, place, and time.    ED Results / Procedures / Treatments    Labs (all labs ordered are listed, but only abnormal results are displayed) Labs Reviewed  RESP PANEL BY RT-PCR (FLU A&B, COVID) ARPGX2 - Abnormal; Notable for the following components:      Result Value   Influenza A by PCR POSITIVE (*)    All other components within normal limits  CBC WITH DIFFERENTIAL/PLATELET - Abnormal; Notable for the following components:   WBC 12.0 (*)    nRBC 0.3 (*)    Neutro Abs 10.6 (*)    Abs Immature Granulocytes 0.28 (*)    All other components within normal limits  COMPREHENSIVE METABOLIC PANEL - Abnormal; Notable for the following components:   Sodium 132 (*)    Potassium 3.0 (*)    Chloride 94 (*)    Glucose, Bld 125 (*)    BUN 54 (*)    Creatinine, Ser 1.04 (*)    Calcium 8.2 (*)    Albumin 2.3 (*)    GFR, Estimated 54 (*)    All other components within normal limits  BRAIN NATRIURETIC PEPTIDE - Abnormal; Notable for the following components:   B Natriuretic Peptide 369.2 (*)    All other components within normal limits  TROPONIN I (HIGH SENSITIVITY) - Abnormal; Notable for the following components:   Troponin I (High Sensitivity) 452 (*)    All other components within normal limits  TROPONIN I (HIGH SENSITIVITY) - Abnormal; Notable for the following components:   Troponin I (High Sensitivity) 416 (*)    All other components within normal limits  CULTURE, BLOOD (ROUTINE X 2)  CULTURE, BLOOD (ROUTINE X 2)  LACTIC ACID, PLASMA  LACTIC ACID, PLASMA  MAGNESIUM    EKG EKG Interpretation  Date/Time:  Saturday April 23 2021 17:49:23 EST Ventricular Rate:  158 PR Interval:    QRS Duration: 93 QT Interval:  317 QTC Calculation: 409 R Axis:   41 Text Interpretation: sinus rhythm with sinus arrythmia Ventricular tachycardia, unsustained Abnormal R-wave progression, early transition Repolarization abnormality, prob rate related Since prior ECG, nonsustained VT is new, Confirmed by Alvira Monday ([id number]) on 04/23/2021 5:53:02  PM  Radiology CT Head Wo Contrast  Result Date: 04/23/2021 CLINICAL DATA:  Delirium EXAM: CT HEAD WITHOUT CONTRAST TECHNIQUE: Contiguous axial images were obtained from the base of the skull through the vertex without intravenous contrast. COMPARISON:  07/13/2007 FINDINGS: Brain: There is no mass, hemorrhage or extra-axial collection. There is generalized atrophy without lobar predilection. Hypodensity of the white matter is most commonly associated with chronic microvascular disease. Vascular: No abnormal hyperdensity of the major intracranial arteries or dural venous sinuses. No intracranial atherosclerosis. Skull: The visualized skull base, calvarium and extracranial soft tissues  are normal. Sinuses/Orbits: Diffuse mild paranasal sinus mucosal thickening. The orbits are normal. IMPRESSION: Generalized atrophy and chronic microvascular ischemia without acute intracranial abnormality. Electronically Signed   By: Ulyses Jarred M.D.   On: 04/23/2021 21:24   DG Chest Portable 1 View  Result Date: 04/23/2021 CLINICAL DATA:  Pneumonia. EXAM: PORTABLE CHEST 1 VIEW COMPARISON:  Chest radiograph dated 07/13/2007. FINDINGS: There is diffuse chronic intra coarsening and bronchitic changes. Diffuse interstitial nodularity and areas of airspace density in the left mid to lower lung field concerning for pneumonia. No pleural effusion pneumothorax. The cardiac silhouette is within normal limits. Atherosclerotic calcification of the aorta. No acute osseous pathology. IMPRESSION: Findings concerning for pneumonia in the left mid to lower lung field. Electronically Signed   By: Anner Crete M.D.   On: 04/23/2021 20:56    Procedures Procedures   Medications Ordered in ED Medications  ceFEPIme (MAXIPIME) 2 g in sodium chloride 0.9 % 100 mL IVPB (0 g Intravenous Stopped 04/23/21 2302)  vancomycin (VANCOCIN) IVPB 1000 mg/200 mL premix (1,000 mg Intravenous New Bag/Given 04/24/21 0039)  vancomycin (VANCOREADY)  IVPB 500 mg/100 mL (has no administration in time range)  lactated ringers bolus 1,000 mL ( Intravenous Stopped 04/23/21 2008)  potassium chloride 10 mEq in 100 mL IVPB (0 mEq Intravenous Stopped 04/23/21 2333)  potassium chloride SA (KLOR-CON M) CR tablet 40 mEq (40 mEq Oral Given 04/23/21 2229)  oseltamivir (TAMIFLU) capsule 75 mg (75 mg Oral Given 04/23/21 2228)    ED Course  I have reviewed the triage vital signs and the nursing notes.  Pertinent labs & imaging results that were available during my care of the patient were reviewed by me and considered in my medical decision making (see chart for details).    MDM Rules/Calculators/A&P                         80yo female with history of hypertension, prior episode of arrhythmia 10/2019, hyperlipidemia, who presents with concern for cough, severe fatigue, dyspnea.   Oxygen saturations to 89% on arrival, having frequent episodes of possible SVT or afib with aberrancy versus nonsustained VT.  Discussed with Dr. Paticia Stack Cardiology fellow.  Labs significant for elevated troponin 452, no chest pain, likely secondary to acute illness and arrhythmia. Troponin stable on recheck.  Influenza testing positive-symptoms for 6 days but given high risk will treat with tamiflu.  CXR shows left sided pneumonia, given vanc/cefepime.   Mild hypokalemia, given K replacement.  Arrythmia improving with K.    Will admit to hospitalist service with plan for Cardiology consult in AM.      Final Clinical Impression(s) / ED Diagnoses Final diagnoses:  Nonsustained ventricular tachycardia  Pneumonia of left lower lobe due to infectious organism  Influenza A    Rx / DC Orders ED Discharge Orders     None        Gareth Morgan, MD 04/24/21 0104

## 2021-04-23 NOTE — ED Notes (Signed)
Pt using restroom before CT scan

## 2021-04-24 ENCOUNTER — Inpatient Hospital Stay (HOSPITAL_COMMUNITY): Payer: Medicare Other

## 2021-04-24 ENCOUNTER — Encounter (HOSPITAL_COMMUNITY): Payer: Self-pay | Admitting: Internal Medicine

## 2021-04-24 DIAGNOSIS — I4729 Other ventricular tachycardia: Secondary | ICD-10-CM | POA: Diagnosis not present

## 2021-04-24 DIAGNOSIS — E871 Hypo-osmolality and hyponatremia: Secondary | ICD-10-CM | POA: Diagnosis present

## 2021-04-24 DIAGNOSIS — R5381 Other malaise: Secondary | ICD-10-CM | POA: Diagnosis not present

## 2021-04-24 DIAGNOSIS — I472 Ventricular tachycardia, unspecified: Secondary | ICD-10-CM | POA: Diagnosis present

## 2021-04-24 DIAGNOSIS — J9601 Acute respiratory failure with hypoxia: Secondary | ICD-10-CM

## 2021-04-24 DIAGNOSIS — E876 Hypokalemia: Secondary | ICD-10-CM | POA: Diagnosis present

## 2021-04-24 DIAGNOSIS — Z79899 Other long term (current) drug therapy: Secondary | ICD-10-CM | POA: Diagnosis not present

## 2021-04-24 DIAGNOSIS — G9341 Metabolic encephalopathy: Secondary | ICD-10-CM | POA: Diagnosis present

## 2021-04-24 DIAGNOSIS — R7881 Bacteremia: Secondary | ICD-10-CM | POA: Diagnosis not present

## 2021-04-24 DIAGNOSIS — J189 Pneumonia, unspecified organism: Secondary | ICD-10-CM | POA: Diagnosis not present

## 2021-04-24 DIAGNOSIS — E440 Moderate protein-calorie malnutrition: Secondary | ICD-10-CM | POA: Diagnosis present

## 2021-04-24 DIAGNOSIS — J101 Influenza due to other identified influenza virus with other respiratory manifestations: Secondary | ICD-10-CM

## 2021-04-24 DIAGNOSIS — A413 Sepsis due to Hemophilus influenzae: Secondary | ICD-10-CM | POA: Diagnosis present

## 2021-04-24 DIAGNOSIS — K5900 Constipation, unspecified: Secondary | ICD-10-CM | POA: Diagnosis present

## 2021-04-24 DIAGNOSIS — E669 Obesity, unspecified: Secondary | ICD-10-CM | POA: Diagnosis present

## 2021-04-24 DIAGNOSIS — E8809 Other disorders of plasma-protein metabolism, not elsewhere classified: Secondary | ICD-10-CM | POA: Diagnosis present

## 2021-04-24 DIAGNOSIS — J129 Viral pneumonia, unspecified: Secondary | ICD-10-CM | POA: Diagnosis not present

## 2021-04-24 DIAGNOSIS — I248 Other forms of acute ischemic heart disease: Secondary | ICD-10-CM | POA: Diagnosis present

## 2021-04-24 DIAGNOSIS — K5901 Slow transit constipation: Secondary | ICD-10-CM | POA: Diagnosis not present

## 2021-04-24 DIAGNOSIS — R4781 Slurred speech: Secondary | ICD-10-CM | POA: Diagnosis present

## 2021-04-24 DIAGNOSIS — D649 Anemia, unspecified: Secondary | ICD-10-CM | POA: Diagnosis present

## 2021-04-24 DIAGNOSIS — E785 Hyperlipidemia, unspecified: Secondary | ICD-10-CM | POA: Diagnosis present

## 2021-04-24 DIAGNOSIS — Z20822 Contact with and (suspected) exposure to covid-19: Secondary | ICD-10-CM | POA: Diagnosis present

## 2021-04-24 DIAGNOSIS — J1001 Influenza due to other identified influenza virus with the same other identified influenza virus pneumonia: Secondary | ICD-10-CM | POA: Diagnosis present

## 2021-04-24 DIAGNOSIS — Z6832 Body mass index (BMI) 32.0-32.9, adult: Secondary | ICD-10-CM | POA: Diagnosis not present

## 2021-04-24 DIAGNOSIS — A492 Hemophilus influenzae infection, unspecified site: Secondary | ICD-10-CM | POA: Diagnosis not present

## 2021-04-24 DIAGNOSIS — E861 Hypovolemia: Secondary | ICD-10-CM | POA: Diagnosis present

## 2021-04-24 DIAGNOSIS — I10 Essential (primary) hypertension: Secondary | ICD-10-CM | POA: Diagnosis present

## 2021-04-24 DIAGNOSIS — I471 Supraventricular tachycardia: Secondary | ICD-10-CM | POA: Diagnosis present

## 2021-04-24 LAB — CBC WITH DIFFERENTIAL/PLATELET
Abs Immature Granulocytes: 0 10*3/uL (ref 0.00–0.07)
Basophils Absolute: 0 10*3/uL (ref 0.0–0.1)
Basophils Relative: 0 %
Eosinophils Absolute: 0.2 10*3/uL (ref 0.0–0.5)
Eosinophils Relative: 1 %
HCT: 36.3 % (ref 36.0–46.0)
Hemoglobin: 12 g/dL (ref 12.0–15.0)
Lymphocytes Relative: 7 %
Lymphs Abs: 1.2 10*3/uL (ref 0.7–4.0)
MCH: 28.6 pg (ref 26.0–34.0)
MCHC: 33.1 g/dL (ref 30.0–36.0)
MCV: 86.4 fL (ref 80.0–100.0)
Monocytes Absolute: 0.7 10*3/uL (ref 0.1–1.0)
Monocytes Relative: 4 %
Neutro Abs: 14.9 10*3/uL — ABNORMAL HIGH (ref 1.7–7.7)
Neutrophils Relative %: 88 %
Platelets: 273 10*3/uL (ref 150–400)
RBC: 4.2 MIL/uL (ref 3.87–5.11)
RDW: 13.8 % (ref 11.5–15.5)
WBC: 16.9 10*3/uL — ABNORMAL HIGH (ref 4.0–10.5)
nRBC: 0.2 % (ref 0.0–0.2)
nRBC: 1 /100 WBC — ABNORMAL HIGH

## 2021-04-24 LAB — BASIC METABOLIC PANEL
Anion gap: 10 (ref 5–15)
BUN: 35 mg/dL — ABNORMAL HIGH (ref 8–23)
CO2: 23 mmol/L (ref 22–32)
Calcium: 8.3 mg/dL — ABNORMAL LOW (ref 8.9–10.3)
Chloride: 101 mmol/L (ref 98–111)
Creatinine, Ser: 0.75 mg/dL (ref 0.44–1.00)
GFR, Estimated: >60 mL/min (ref >60)
Glucose, Bld: 112 mg/dL — ABNORMAL HIGH (ref 70–99)
Potassium: 4.5 mmol/L (ref 3.5–5.1)
Sodium: 134 mmol/L — ABNORMAL LOW (ref 135–145)

## 2021-04-24 LAB — LACTIC ACID, PLASMA
Lactic Acid, Venous: 1.4 mmol/L (ref 0.5–1.9)
Lactic Acid, Venous: 1.2 mmol/L (ref 0.5–1.9)

## 2021-04-24 LAB — HEPATIC FUNCTION PANEL
ALT: 26 U/L (ref 0–44)
AST: 27 U/L (ref 15–41)
Albumin: 1.8 g/dL — ABNORMAL LOW (ref 3.5–5.0)
Alkaline Phosphatase: 71 U/L (ref 38–126)
Bilirubin, Direct: 0.3 mg/dL — ABNORMAL HIGH (ref 0.0–0.2)
Indirect Bilirubin: 0.7 mg/dL (ref 0.3–0.9)
Total Bilirubin: 1 mg/dL (ref 0.3–1.2)
Total Protein: 5.7 g/dL — ABNORMAL LOW (ref 6.5–8.1)

## 2021-04-24 LAB — STREP PNEUMONIAE URINARY ANTIGEN: Strep Pneumo Urinary Antigen: NEGATIVE

## 2021-04-24 LAB — HIV ANTIBODY (ROUTINE TESTING W REFLEX): HIV Screen 4th Generation wRfx: NONREACTIVE

## 2021-04-24 LAB — ECHOCARDIOGRAM COMPLETE
AR max vel: 1.85 cm2
AV Area VTI: 1.76 cm2
AV Area mean vel: 2.03 cm2
AV Mean grad: 8 mmHg
AV Peak grad: 16.6 mmHg
Ao pk vel: 2.04 m/s
Area-P 1/2: 2.2 cm2
Height: 60 in
S' Lateral: 2.8 cm
Weight: 2645.52 oz

## 2021-04-24 LAB — MAGNESIUM: Magnesium: 1.8 mg/dL (ref 1.7–2.4)

## 2021-04-24 LAB — TROPONIN I (HIGH SENSITIVITY)
Troponin I (High Sensitivity): 282 ng/L (ref <18)
Troponin I (High Sensitivity): 257 ng/L (ref <18)

## 2021-04-24 LAB — TSH: TSH: 0.719 u[IU]/mL (ref 0.350–4.500)

## 2021-04-24 MED ORDER — OSELTAMIVIR PHOSPHATE 30 MG PO CAPS
30.0000 mg | ORAL_CAPSULE | Freq: Two times a day (BID) | ORAL | Status: DC
  Administered 2021-04-24 – 2021-04-28 (×9): 30 mg via ORAL
  Filled 2021-04-24 (×9): qty 1

## 2021-04-24 MED ORDER — LACTATED RINGERS IV SOLN
INTRAVENOUS | Status: DC
Start: 2021-04-24 — End: 2021-04-24

## 2021-04-24 MED ORDER — ENOXAPARIN SODIUM 40 MG/0.4ML IJ SOSY
40.0000 mg | PREFILLED_SYRINGE | INTRAMUSCULAR | Status: DC
  Administered 2021-04-24 – 2021-04-29 (×6): 40 mg via SUBCUTANEOUS
  Filled 2021-04-24 (×6): qty 0.4

## 2021-04-24 MED ORDER — FUROSEMIDE 10 MG/ML IJ SOLN
20.0000 mg | Freq: Once | INTRAMUSCULAR | Status: AC
  Administered 2021-04-24: 14:00:00 20 mg via INTRAVENOUS
  Filled 2021-04-24: qty 2

## 2021-04-24 MED ORDER — LEVALBUTEROL HCL 0.63 MG/3ML IN NEBU
0.6300 mg | INHALATION_SOLUTION | Freq: Four times a day (QID) | RESPIRATORY_TRACT | Status: DC
  Administered 2021-04-24 – 2021-04-25 (×4): 0.63 mg via RESPIRATORY_TRACT
  Filled 2021-04-24 (×4): qty 3

## 2021-04-24 MED ORDER — PERFLUTREN LIPID MICROSPHERE
1.0000 mL | INTRAVENOUS | Status: AC | PRN
  Administered 2021-04-24: 17:00:00 3 mL via INTRAVENOUS
  Filled 2021-04-24: qty 10

## 2021-04-24 MED ORDER — METOPROLOL TARTRATE 25 MG PO TABS
25.0000 mg | ORAL_TABLET | Freq: Two times a day (BID) | ORAL | Status: DC
  Administered 2021-04-24 – 2021-04-27 (×7): 25 mg via ORAL
  Filled 2021-04-24 (×7): qty 1

## 2021-04-24 MED ORDER — SODIUM CHLORIDE 0.9 % IV SOLN
2.0000 g | INTRAVENOUS | Status: DC
  Administered 2021-04-24 – 2021-04-25 (×2): 2 g via INTRAVENOUS
  Filled 2021-04-24 (×3): qty 20

## 2021-04-24 MED ORDER — GUAIFENESIN 100 MG/5ML PO LIQD: 5.0000 mL | ORAL | Status: DC | PRN

## 2021-04-24 MED ORDER — GUAIFENESIN ER 600 MG PO TB12
600.0000 mg | ORAL_TABLET | Freq: Two times a day (BID) | ORAL | Status: DC
  Administered 2021-04-24 – 2021-04-29 (×11): 600 mg via ORAL
  Filled 2021-04-24 (×11): qty 1

## 2021-04-24 MED ORDER — ASPIRIN EC 81 MG PO TBEC
81.0000 mg | DELAYED_RELEASE_TABLET | Freq: Every day | ORAL | Status: DC
  Administered 2021-04-24 – 2021-04-29 (×6): 81 mg via ORAL
  Filled 2021-04-24 (×6): qty 1

## 2021-04-24 MED ORDER — SODIUM CHLORIDE 0.9 % IV SOLN
100.0000 mg | Freq: Two times a day (BID) | INTRAVENOUS | Status: DC
  Administered 2021-04-24 – 2021-04-25 (×3): 100 mg via INTRAVENOUS
  Filled 2021-04-24 (×3): qty 100

## 2021-04-24 MED ORDER — LEVALBUTEROL HCL 0.63 MG/3ML IN NEBU
0.6300 mg | INHALATION_SOLUTION | Freq: Three times a day (TID) | RESPIRATORY_TRACT | Status: DC
  Administered 2021-04-24: 15:00:00 0.63 mg via RESPIRATORY_TRACT
  Filled 2021-04-24: qty 3

## 2021-04-24 MED ORDER — MAGNESIUM SULFATE 2 GM/50ML IV SOLN
2.0000 g | Freq: Once | INTRAVENOUS | Status: AC
  Administered 2021-04-24: 14:00:00 2 g via INTRAVENOUS
  Filled 2021-04-24: qty 50

## 2021-04-24 NOTE — H&P (Addendum)
History and Physical    Brooke Harrington ZTI:[id number] DOB: 1941/03/29 DOA: 04/23/2021  PCP: Patient, No Pcp Per (Inactive)  Patient coming from: Home.  Chief Complaint: Cough congestion and slurring of speech.  HPI: Brooke Harrington is a 80 y.o. female with history of hypertension hyperlipidemia was brought to the ER after patient has been having cough congestion and symptoms of flu for the last 5 days.  Patient also over the last 2 days was noticed to have increasing slurring of speech confusion.  When I discussed with patient patient states she has not been feeling well for the last many days.  Has been having cough and she was taking over-the-counter cough medications.  She is not able to give a proper history as she appears mildly confused.  I was unable to reach patient's daughter and son with the contact number provided in the chart.  ED Course: In the ER patient was noticed to be easily desaturating and also chest x-ray showing infiltrates concerning for pneumonia.  Patient's flu test came back positive.  Patient while in the ER had brief runs of tachycardia which was concerning for NSVT versus SVT or possible A. fib with aberrancy as discussed with on-call cardiologist Dr. Lynnae Sandhoff.  Patient's troponin came back positive but remained flat.  Patient was started on empiric antibiotics Tamiflu and fluids admitted for further management.  On my exam patient is able to move all extremities.  Labs are significant for hypokalemia with potassium of 3 for which replacement was given.  Creatinine 1.04 BNP of 369 WBC of 12.  CT head was unremarkable.  Review of Systems: As per HPI, rest all negative.   Past Medical History:  Diagnosis Date   Hypertension     Past Surgical History:  Procedure Laterality Date   HAND SURGERY       reports that she has never smoked. She has never used smokeless tobacco. She reports current alcohol use. She reports that she does not use drugs.  No Known  Allergies  History reviewed. No pertinent family history.  Prior to Admission medications   Medication Sig Start Date End Date Taking? Authorizing Provider  lisinopril-hydrochlorothiazide (ZESTORETIC) 20-25 MG tablet Take 1 tablet by mouth daily. 09/09/19   [provider]  metoprolol succinate (TOPROL-XL) 25 MG 24 hr tablet Take 25 mg by mouth daily. 09/09/19   [provider]  polyethylene glycol (MIRALAX) 17 g packet Take 17 g by mouth 2 (two) times daily. 10/16/18   Elvina Sidle, MD  rosuvastatin (CRESTOR) 10 MG tablet Take by mouth as directed. 09/09/19   [provider]    Physical Exam: Constitutional: Moderately built and nourished. Vitals:   04/23/21 2230 04/24/21 0000 04/24/21 0030 04/24/21 0149  BP: (!) 150/111  135/65 (!) 159/79  Pulse: 97  (!) 101 (!) 118  Resp: (!) 21 (!) 25  (!) 34  Temp: 98.9 F (37.2 C)   98.3 F (36.8 C)  TempSrc: Oral   Oral  SpO2: 98%  94% 98%  Weight:    75 kg  Height:    5' (1.524 m)   Eyes: Anicteric no pallor. ENMT: No discharge from the ears eyes nose and mouth. Neck: No mass felt.  No neck rigidity. Respiratory: No rhonchi or crepitations. Cardiovascular: S1-S2 heard. Abdomen: Soft nontender bowel sound present. Musculoskeletal: No edema. Skin: No rash. Neurologic: Alert awake oriented to her name and place but appears confused.  Moving all extremities. Psychiatric: Oriented to her name and  place.   Labs on Admission: I have personally reviewed following labs and imaging studies  CBC: Recent Labs  Lab 04/23/21 1852  WBC 12.0*  NEUTROABS 10.6*  HGB 12.6  HCT 38.5  MCV 85.2  PLT 0000000   Basic Metabolic Panel: Recent Labs  Lab 04/23/21 1852 04/23/21 2036  NA 132*  --   K 3.0*  --   CL 94*  --   CO2 25  --   GLUCOSE 125*  --   BUN 54*  --   CREATININE 1.04*  --   CALCIUM 8.2*  --   MG  --  1.9   GFR: Estimated Creatinine Clearance: 39 mL/min (A) (by C-G formula based on SCr of 1.04  mg/dL (H)). Liver Function Tests: Recent Labs  Lab 04/23/21 1852  AST 39  ALT 30  ALKPHOS 73  BILITOT 0.8  PROT 6.6  ALBUMIN 2.3*   No results for input(s): LIPASE, AMYLASE in the last 168 hours. No results for input(s): AMMONIA in the last 168 hours. Coagulation Profile: No results for input(s): INR, PROTIME in the last 168 hours. Cardiac Enzymes: No results for input(s): CKTOTAL, CKMB, CKMBINDEX, TROPONINI in the last 168 hours. BNP (last 3 results) No results for input(s): PROBNP in the last 8760 hours. HbA1C: No results for input(s): HGBA1C in the last 72 hours. CBG: No results for input(s): GLUCAP in the last 168 hours. Lipid Profile: No results for input(s): CHOL, HDL, LDLCALC, TRIG, CHOLHDL, LDLDIRECT in the last 72 hours. Thyroid Function Tests: No results for input(s): TSH, T4TOTAL, FREET4, T3FREE, THYROIDAB in the last 72 hours. Anemia Panel: No results for input(s): VITAMINB12, FOLATE, FERRITIN, TIBC, IRON, RETICCTPCT in the last 72 hours. Urine analysis: No results found for: COLORURINE, APPEARANCEUR, LABSPEC, PHURINE, GLUCOSEU, HGBUR, BILIRUBINUR, KETONESUR, PROTEINUR, UROBILINOGEN, NITRITE, LEUKOCYTESUR Sepsis Labs: @LABRCNTIP (procalcitonin:4,lacticidven:4) ) Recent Results (from the past 240 hour(s))  Resp Panel by RT-PCR (Flu A&B, Covid) Nasopharyngeal Swab     Status: Abnormal   Collection Time: 04/23/21  6:52 PM   Specimen: Nasopharyngeal Swab; Nasopharyngeal(NP) swabs in vial transport medium  Result Value Ref Range Status   SARS Coronavirus 2 by RT PCR NEGATIVE NEGATIVE Final    Comment: (NOTE) SARS-CoV-2 target nucleic acids are NOT DETECTED.  The SARS-CoV-2 RNA is generally detectable in upper respiratory specimens during the acute phase of infection. The lowest concentration of SARS-CoV-2 viral copies this assay can detect is 138 copies/mL. A negative result does not preclude SARS-Cov-2 infection and should not be used as the sole basis for  treatment or other patient management decisions. A negative result may occur with  improper specimen collection/handling, submission of specimen other than nasopharyngeal swab, presence of viral mutation(s) within the areas targeted by this assay, and inadequate number of viral copies(<138 copies/mL). A negative result must be combined with clinical observations, patient history, and epidemiological information. The expected result is Negative.  Fact Sheet for Patients:  EntrepreneurPulse.com.au  Fact Sheet for Healthcare Providers:  IncredibleEmployment.be  This test is no t yet approved or cleared by the [place] FDA and  has been authorized for detection and/or diagnosis of SARS-CoV-2 by FDA under an Emergency Use Authorization (EUA). This EUA will remain  in effect (meaning this test can be used) for the duration of the COVID-19 declaration under Section 564(b)(1) of the Act, 21 U.S.C.section 360bbb-3(b)(1), unless the authorization is terminated  or revoked sooner.       Influenza A by PCR POSITIVE (A) NEGATIVE Final   Influenza  B by PCR NEGATIVE NEGATIVE Final    Comment: (NOTE) The Xpert Xpress SARS-CoV-2/FLU/RSV plus assay is intended as an aid in the diagnosis of influenza from Nasopharyngeal swab specimens and should not be used as a sole basis for treatment. Nasal washings and aspirates are unacceptable for Xpert Xpress SARS-CoV-2/FLU/RSV testing.  Fact Sheet for Patients: EntrepreneurPulse.com.au  Fact Sheet for Healthcare Providers: IncredibleEmployment.be  This test is not yet approved or cleared by the [place] FDA and has been authorized for detection and/or diagnosis of SARS-CoV-2 by FDA under an Emergency Use Authorization (EUA). This EUA will remain in effect (meaning this test can be used) for the duration of the COVID-19 declaration under Section 564(b)(1) of the Act, 21  U.S.C. section 360bbb-3(b)(1), unless the authorization is terminated or revoked.  Performed at [facility], [address] [place], [place] [address]      Radiological Exams on Admission: CT Head Wo Contrast  Result Date: 04/23/2021 CLINICAL DATA:  Delirium EXAM: CT HEAD WITHOUT CONTRAST TECHNIQUE: Contiguous axial images were obtained from the base of the skull through the vertex without intravenous contrast. COMPARISON:  07/13/2007 FINDINGS: Brain: There is no mass, hemorrhage or extra-axial collection. There is generalized atrophy without lobar predilection. Hypodensity of the white matter is most commonly associated with chronic microvascular disease. Vascular: No abnormal hyperdensity of the major intracranial arteries or dural venous sinuses. No intracranial atherosclerosis. Skull: The visualized skull base, calvarium and extracranial soft tissues are normal. Sinuses/Orbits: Diffuse mild paranasal sinus mucosal thickening. The orbits are normal. IMPRESSION: Generalized atrophy and chronic microvascular ischemia without acute intracranial abnormality. Electronically Signed   By: Ulyses Jarred M.D.   On: 04/23/2021 21:24   DG Chest Portable 1 View  Result Date: 04/23/2021 CLINICAL DATA:  Pneumonia. EXAM: PORTABLE CHEST 1 VIEW COMPARISON:  Chest radiograph dated 07/13/2007. FINDINGS: There is diffuse chronic intra coarsening and bronchitic changes. Diffuse interstitial nodularity and areas of airspace density in the left mid to lower lung field concerning for pneumonia. No pleural effusion pneumothorax. The cardiac silhouette is within normal limits. Atherosclerotic calcification of the aorta. No acute osseous pathology. IMPRESSION: Findings concerning for pneumonia in the left mid to lower lung field. Electronically Signed   By: Anner Crete M.D.   On: 04/23/2021 20:56    EKG: Independently reviewed.  Patient is EKG shows runs of tachycardia which I discussed with on-call  cardiologist Dr. Olena Heckle at this time feels that these runs may be either a A. fib with aberrancy or NSVT or SVT.  Assessment/Plan Principal Problem:   Influenza A Active Problems:   Nonsustained ventricular tachycardia   Pneumonia of left lower lobe due to infectious organism   CAP (community acquired pneumonia)    Pneumonia -patient also is flu positive for which patient has been placed on Tamiflu however we will also continue with empiric antibiotics for community-acquired pneumonia.  Avoiding Levaquin and Zithromax due to arrhythmia seen in the telemetry.  Follow cultures.  We will avoid further fluids since patient's BNP is elevated.  We will repeat a chest x-ray. Tachycardia which I discussed with on-call cardiologist Dr. Olena Heckle who at this time felt that the rhythm was not clear but differentials include NSVT versus SVT versus A. fib with aberrancy.  Advised to continue to monitor at this time.  I am going to place patient on metoprolol 25 mg p.o. twice daily trend cardiac markers check 2D echo and check TSH.  Patient's potassium was corrected in the ER.  Recheck potassium and magnesium levels. Elevated troponin could be related to tachycardia.  However BNP is also elevated.  We will trend troponins 2D echo keep patient on aspirin and metoprolol.  Consult cardiology.  We will hold off further fluids given the elevated BNP Acute encephalopathy -appears confused likely from pneumonia and metabolic process.  We will get MRI of the brain. History of hyperlipidemia presently not on any medications.  Since patient has persistent tachycardia with abnormal beats concerning for NSVT or SVT and pneumonia with encephalopathy will need inpatient status.   Addendum -was able to reach patient's son Mr. Percell Miller.  Patient's son states that patient has been not feeling well for the last 5 days increasingly confused difficult to walk over the last 2 days with slurred speech.  Patient's grandson was sick with  flu last week.  Patient has not been on any medications now.  About a year ago patient was diagnosed with some abnormal heart rhythm but patient's son does not know clearly what it was.  Patient was not prescribed any medication at that time.   DVT prophylaxis: Lovenox. Code Status: Full code. Family Communication: Patient's son. Disposition Plan: Home. Consults called: We will need cardiology consult. Admission status: Inpatient.   Rise Patience MD [facility] Pager [phone].  If 7PM-7AM, please contact night-coverage [email or web address] Password [facility]  04/24/2021, 4:04 AM

## 2021-04-24 NOTE — Plan of Care (Signed)

## 2021-04-24 NOTE — Progress Notes (Signed)
A&Ox4 with no signs of confusion but sometimes difficult to hear/understand (words slurred?). Resp rate in 30s. Some accessory muscle use with expiratory weeze and some rales. Sinus tach with some SVT. Urinary output marginal. Fluids started this AM and on IV ABX. Keeps stating how tired/sleepy she is. Seems restless in bed; groans and maybe some sleep talking. Difficult phlebotomy stick, so some labs delayed.

## 2021-04-24 NOTE — Progress Notes (Signed)
*  PRELIMINARY RESULTS* Echocardiogram 2D Echocardiogram has been performed with Definity.  Stacey Drain 04/24/2021, 5:00 PM

## 2021-04-24 NOTE — Progress Notes (Addendum)
[facility] PROGRESS NOTE   MAKHYA TUMBARELLO [id number] DOB: [birth year] DOA: 04/23/2021  PCP: Patient, No Pcp Per (Inactive)  Brief History/Interval Summary: 80 y.o. female with history of hypertension hyperlipidemia was brought to the ER after patient has been having cough congestion and symptoms of flu for the last 5 days.  Patient also over the last 2 days was noticed to have increasing slurring of speech and confusion.  She was noted to be seated desaturating in the emergency department.  Chest x-ray raised concern for pneumonia.  She was also noted to have brief runs of tachycardia concerning for SVT versus NSVT.  Patient was hospitalized for further management.   Reason for Visit: Pneumonia secondary to influenza  Consultants: None  Procedures: None    Subjective/Interval History: Patient noted to be somewhat distracted but able to answer questions.  She mentions that she did not get much sleep last night.  She denies any chest pain.  Does admit to shortness of breath and cough.  Denies any headaches.  No nausea vomiting.    Assessment/Plan:  Pneumonia secondary to influenza/acute respiratory failure with hypoxia/sepsis present on admission Patient was noted to be tachycardic, tachypneic, hypoxic and had elevated WBC. Patient placed on Tamiflu.  There was concern for secondary bacterial infection due to many days of symptoms.  She was placed on ceftriaxone and doxycycline.  Follow-up on culture data. She was noted to desaturate into the mid to late 80s.  Had to be placed on oxygen.  Currently on 3 L of oxygen by nasal cannula.  Saturations are in the mid 90s. Chest x-ray was repeated this morning and raised concern for fluid overload.  Heart is noted to be normal size.  No echocardiogram data in our system. Give 1 dose of Lasix 20 mg.  We will order echocardiogram. Nebulizer treatment with Xopenex due to tachycardia.  Hold off on steroids for now.  Unspecified  tachyarrhythmia EKGs reviewed.  Most of them appears to show sinus tachycardia with PACs.  There is 1 which shows concern for nonsustained ventricular tachycardia.  Patient has been placed back on her metoprolol.  We will monitor telemetry trends.  TSH is noted to be normal.  Follow-up on echocardiogram.  Case was discussed with night cardiologist.  We will consider discussing with cardiology tomorrow once we have more data available.  She appears to be stable from a hemodynamic standpoint. Potassium is 4.5.  Magnesium 1.8.  Elevated troponin Could be due to demand ischemia from infectious process.  Could also be from tachyarrhythmia.  BNP is noted to be mildly elevated.  Lasix x1 as discussed above.  Follow-up on echocardiogram.  Hold off on heparin infusion for now.  Acute metabolic encephalopathy Most likely due to infectious process.  No focal neurological deficits noted.  CT head did not show any acute findings. MRI brain has been ordered.  Mild hyponatremia Continue to monitor.  Obesity Estimated body mass index is 32.29 kg/m as calculated from the following:   Height as of this encounter: 5' (1.524 m).   Weight as of this encounter: 75 kg.   DVT Prophylaxis: Lovenox Code Status: Full code Family Communication: Discussed with patient.  No family at bedside. Disposition Plan: To be determined.  Will involve PT and OT once she is more stable.  Status is: Inpatient  Remains inpatient appropriate because: Acute respiratory failure with hypoxia, pneumonia     Medications: Scheduled:  aspirin EC  81 mg Oral Daily   enoxaparin (LOVENOX)  injection  40 mg Subcutaneous Q24H   furosemide  20 mg Intravenous Once   guaiFENesin  600 mg Oral BID   levalbuterol  0.63 mg Nebulization Q8H   metoprolol tartrate  25 mg Oral BID   oseltamivir  30 mg Oral BID   Continuous:  cefTRIAXone (ROCEPHIN)  IV     doxycycline (VIBRAMYCIN) IV 100 mg (04/24/21 0552)    [id number]  Antibiotics: Anti-infectives (From admission, onward)    Start     Dose/Rate Route Frequency Ordered Stop   04/24/21 2200  vancomycin (VANCOREADY) IVPB 500 mg/100 mL  Status:  Discontinued        500 mg 100 mL/hr over 60 Minutes Intravenous Every 24 hours 04/23/21 2118 04/24/21 0402   04/24/21 1400  cefTRIAXone (ROCEPHIN) 2 g in sodium chloride 0.9 % 100 mL IVPB        2 g 200 mL/hr over 30 Minutes Intravenous Every 24 hours 04/24/21 0402 04/29/21 1359   04/24/21 1000  oseltamivir (TAMIFLU) capsule 30 mg        30 mg Oral 2 times daily 04/24/21 0409 04/28/21 2159   04/24/21 0500  doxycycline (VIBRAMYCIN) 100 mg in sodium chloride 0.9 % 250 mL IVPB        100 mg 125 mL/hr over 120 Minutes Intravenous Every 12 hours 04/24/21 0405     04/23/21 2200  ceFEPIme (MAXIPIME) 2 g in sodium chloride 0.9 % 100 mL IVPB  Status:  Discontinued        2 g 200 mL/hr over 30 Minutes Intravenous Every 12 hours 04/23/21 2118 04/24/21 0402   04/23/21 2200  vancomycin (VANCOCIN) IVPB 1000 mg/200 mL premix        1,000 mg 200 mL/hr over 60 Minutes Intravenous  Once 04/23/21 2118 04/24/21 0157   04/23/21 2015  oseltamivir (TAMIFLU) capsule 75 mg        75 mg Oral  Once 04/23/21 2003 04/23/21 2228       Objective:  Vital Signs  Vitals:   04/24/21 0030 04/24/21 0149 04/24/21 0345 04/24/21 0758  BP: 135/65 (!) 159/79 (!) 136/54 111/66  Pulse: (!) 101 (!) 118 96 (!) 103  Resp:  (!) 34    Temp:  98.3 F (36.8 C)  97.8 F (36.6 C)  TempSrc:  Oral  Oral  SpO2: 94% 98% 95% 93%  Weight:  75 kg    Height:  5' (1.524 m)      Intake/Output Summary (Last 24 hours) at 04/24/2021 0948 Last data filed at 04/24/2021 0600 Gross per 24 hour  Intake 1645.54 ml  Output 100 ml  Net 1545.54 ml   Filed Weights   04/24/21 0149  Weight: 75 kg    General appearance: Awake alert.  In no distress.  Distracted Resp: Tachypneic.  Coarse breath sounds bilaterally with crackles.  Occasional  wheezing. Cardio: S1-S2 is tachycardic.  No S3-S4.  No rubs murmurs or bruit GI: Abdomen is soft.  Nontender nondistended.  Bowel sounds are present normal.  No masses organomegaly Extremities: No edema.  Moving all of her extremities Neurologic:  No focal neurological deficits.    Lab Results:  Data Reviewed: I have personally reviewed following labs and imaging studies  CBC: Recent Labs  Lab 04/23/21 1852 04/24/21 0559  WBC 12.0* 16.9*  NEUTROABS 10.6* 14.9*  HGB 12.6 12.0  HCT 38.5 36.3  MCV 85.2 86.4  PLT [id number]    Basic Metabolic Panel: Recent Labs  Lab 04/23/21 1852 04/23/21 2036 04/24/21  0559  NA 132*  --  134*  K 3.0*  --  4.5  CL 94*  --  101  CO2 25  --  23  GLUCOSE 125*  --  112*  BUN 54*  --  35*  CREATININE 1.04*  --  0.75  CALCIUM 8.2*  --  8.3*  MG  --  1.9 1.8    GFR: Estimated Creatinine Clearance: 50.7 mL/min (by C-G formula based on SCr of 0.75 mg/dL).  Liver Function Tests: Recent Labs  Lab 04/23/21 1852 04/24/21 0559  AST 39 27  ALT 30 26  ALKPHOS 73 71  BILITOT 0.8 1.0  PROT 6.6 5.7*  ALBUMIN 2.3* 1.8*    Thyroid Function Tests: Recent Labs    04/24/21 0559  TSH 0.719     Recent Results (from the past 240 hour(s))  Resp Panel by RT-PCR (Flu A&B, Covid) Nasopharyngeal Swab     Status: Abnormal   Collection Time: 04/23/21  6:52 PM   Specimen: Nasopharyngeal Swab; Nasopharyngeal(NP) swabs in vial transport medium  Result Value Ref Range Status   SARS Coronavirus 2 by RT PCR NEGATIVE NEGATIVE Final    Comment: (NOTE) SARS-CoV-2 target nucleic acids are NOT DETECTED.  The SARS-CoV-2 RNA is generally detectable in upper respiratory specimens during the acute phase of infection. The lowest concentration of SARS-CoV-2 viral copies this assay can detect is 138 copies/mL. A negative result does not preclude SARS-Cov-2 infection and should not be used as the sole basis for treatment or other patient management decisions. A  negative result may occur with  improper specimen collection/handling, submission of specimen other than nasopharyngeal swab, presence of viral mutation(s) within the areas targeted by this assay, and inadequate number of viral copies(<138 copies/mL). A negative result must be combined with clinical observations, patient history, and epidemiological information. The expected result is Negative.  Fact Sheet for Patients:  BloggerCourse.com  Fact Sheet for Healthcare Providers:  SeriousBroker.it  This test is no t yet approved or cleared by the [place] FDA and  has been authorized for detection and/or diagnosis of SARS-CoV-2 by FDA under an Emergency Use Authorization (EUA). This EUA will remain  in effect (meaning this test can be used) for the duration of the COVID-19 declaration under Section 564(b)(1) of the Act, 21 U.S.C.section 360bbb-3(b)(1), unless the authorization is terminated  or revoked sooner.       Influenza A by PCR POSITIVE (A) NEGATIVE Final   Influenza B by PCR NEGATIVE NEGATIVE Final    Comment: (NOTE) The Xpert Xpress SARS-CoV-2/FLU/RSV plus assay is intended as an aid in the diagnosis of influenza from Nasopharyngeal swab specimens and should not be used as a sole basis for treatment. Nasal washings and aspirates are unacceptable for Xpert Xpress SARS-CoV-2/FLU/RSV testing.  Fact Sheet for Patients: BloggerCourse.com  Fact Sheet for Healthcare Providers: SeriousBroker.it  This test is not yet approved or cleared by the [place] FDA and has been authorized for detection and/or diagnosis of SARS-CoV-2 by FDA under an Emergency Use Authorization (EUA). This EUA will remain in effect (meaning this test can be used) for the duration of the COVID-19 declaration under Section 564(b)(1) of the Act, 21 U.S.C. section 360bbb-3(b)(1), unless the  authorization is terminated or revoked.  Performed at [facility], [address] [place], [place] [address]       Radiology Studies: CT Head Wo Contrast  Result Date: 04/23/2021 CLINICAL DATA:  Delirium EXAM: CT HEAD WITHOUT CONTRAST TECHNIQUE: Contiguous  axial images were obtained from the base of the skull through the vertex without intravenous contrast. COMPARISON:  07/13/2007 FINDINGS: Brain: There is no mass, hemorrhage or extra-axial collection. There is generalized atrophy without lobar predilection. Hypodensity of the white matter is most commonly associated with chronic microvascular disease. Vascular: No abnormal hyperdensity of the major intracranial arteries or dural venous sinuses. No intracranial atherosclerosis. Skull: The visualized skull base, calvarium and extracranial soft tissues are normal. Sinuses/Orbits: Diffuse mild paranasal sinus mucosal thickening. The orbits are normal. IMPRESSION: Generalized atrophy and chronic microvascular ischemia without acute intracranial abnormality. Electronically Signed   By: Ulyses Jarred M.D.   On: 04/23/2021 21:24   DG CHEST PORT 1 VIEW  Result Date: 04/24/2021 CLINICAL DATA:  Shortness of breath, weakness EXAM: PORTABLE CHEST 1 VIEW COMPARISON:  04/23/2021 FINDINGS: Heart is normal size. Diffuse interstitial prominence throughout the lungs, left greater than right. No effusions. No acute bony abnormality. IMPRESSION: Diffuse interstitial prominence throughout the lungs, left greater than right. This could reflect asymmetric edema or infection. Atypical pneumonia is possible. Electronically Signed   By: Rolm Baptise M.D.   On: 04/24/2021 07:47   DG Chest Portable 1 View  Result Date: 04/23/2021 CLINICAL DATA:  Pneumonia. EXAM: PORTABLE CHEST 1 VIEW COMPARISON:  Chest radiograph dated 07/13/2007. FINDINGS: There is diffuse chronic intra coarsening and bronchitic changes. Diffuse interstitial nodularity and areas of airspace  density in the left mid to lower lung field concerning for pneumonia. No pleural effusion pneumothorax. The cardiac silhouette is within normal limits. Atherosclerotic calcification of the aorta. No acute osseous pathology. IMPRESSION: Findings concerning for pneumonia in the left mid to lower lung field. Electronically Signed   By: Anner Crete M.D.   On: 04/23/2021 20:56       LOS: 0 days   [facility] Pager on [email or web address]  04/24/2021, 9:48 AM

## 2021-04-25 DIAGNOSIS — D649 Anemia, unspecified: Secondary | ICD-10-CM

## 2021-04-25 DIAGNOSIS — A492 Hemophilus influenzae infection, unspecified site: Secondary | ICD-10-CM

## 2021-04-25 DIAGNOSIS — R7881 Bacteremia: Secondary | ICD-10-CM

## 2021-04-25 DIAGNOSIS — J189 Pneumonia, unspecified organism: Secondary | ICD-10-CM

## 2021-04-25 LAB — BLOOD CULTURE ID PANEL (REFLEXED) - BCID2

## 2021-04-25 LAB — BASIC METABOLIC PANEL
Anion gap: 10 (ref 5–15)
BUN: 25 mg/dL — ABNORMAL HIGH (ref 8–23)
CO2: 24 mmol/L (ref 22–32)
Calcium: 8 mg/dL — ABNORMAL LOW (ref 8.9–10.3)
Chloride: 102 mmol/L (ref 98–111)
Creatinine, Ser: 0.56 mg/dL (ref 0.44–1.00)
GFR, Estimated: >60 mL/min (ref >60)
Glucose, Bld: 101 mg/dL — ABNORMAL HIGH (ref 70–99)
Potassium: 3.7 mmol/L (ref 3.5–5.1)
Sodium: 136 mmol/L (ref 135–145)

## 2021-04-25 LAB — CBC
HCT: 33.3 % — ABNORMAL LOW (ref 36.0–46.0)
Hemoglobin: 10.7 g/dL — ABNORMAL LOW (ref 12.0–15.0)
MCH: 27.6 pg (ref 26.0–34.0)
MCHC: 32.1 g/dL (ref 30.0–36.0)
MCV: 86 fL (ref 80.0–100.0)
Platelets: 220 10*3/uL (ref 150–400)
RBC: 3.87 MIL/uL (ref 3.87–5.11)
RDW: 14 % (ref 11.5–15.5)
WBC: 12.3 10*3/uL — ABNORMAL HIGH (ref 4.0–10.5)
nRBC: 0 % (ref 0.0–0.2)

## 2021-04-25 LAB — MAGNESIUM: Magnesium: 2.2 mg/dL (ref 1.7–2.4)

## 2021-04-25 MED ORDER — POTASSIUM CHLORIDE 20 MEQ PO PACK
40.0000 meq | PACK | Freq: Once | ORAL | Status: AC
  Administered 2021-04-25: 13:00:00 40 meq via ORAL
  Filled 2021-04-25: qty 2

## 2021-04-25 NOTE — NC FL2 (Signed)
[place] MEDICAID FL2 LEVEL OF CARE SCREENING TOOL     IDENTIFICATION  Patient Name: Brooke Harrington Birthdate: October 16, 1940 Sex: female Admission Date (Current Location): 04/23/2021  [facility] and [place] Number:  Producer, television/film/video and Address:  [facility] [place], [address] [place], [place] [address]      Provider Number: [id number]  Attending Physician Name and Address:  Osvaldo Shipper, MD  Relative Name and Phone Number:       Current Level of Care: Hospital Recommended Level of Care: Skilled Nursing Facility Prior Approval Number:    Date Approved/Denied:   PASRR Number: [id number]  Discharge Plan: SNF    Current Diagnoses: Patient Active Problem List   Diagnosis Date Noted   Nonsustained ventricular tachycardia 04/24/2021   Pneumonia of left lower lobe due to infectious organism 04/24/2021   CAP (community acquired pneumonia) 04/24/2021   Influenza A 04/23/2021    Orientation RESPIRATION BLADDER Height & Weight     Self, Time, Situation, Place  O2 (2LNC) Incontinent, External catheter Weight: 165 lb 5.5 oz (75 kg) Height:  5' (152.4 cm)  BEHAVIORAL SYMPTOMS/MOOD NEUROLOGICAL BOWEL NUTRITION STATUS      Continent Diet (See d/c summary)  AMBULATORY STATUS COMMUNICATION OF NEEDS Skin   Extensive Assist Verbally Normal                       Personal Care Assistance Level of Assistance  Bathing, Feeding, Dressing Bathing Assistance: Maximum assistance Feeding assistance: Independent Dressing Assistance: Maximum assistance     Functional Limitations Info  Sight, Hearing, Speech Sight Info: Impaired Hearing Info: Adequate Speech Info: Adequate    SPECIAL CARE FACTORS FREQUENCY  PT (By licensed PT), OT (By licensed OT)     PT Frequency: 5x/week OT Frequency: 5x/week            Contractures Contractures Info: Not present    Additional Factors Info  Code Status, Allergies Code Status Info: Full code Allergies  Info: no known allergies           Current Medications (04/25/2021):  This is the current hospital active medication list Current Facility-Administered Medications  Medication Dose Route Frequency Provider Last Rate Last Admin   aspirin EC tablet 81 mg  81 mg Oral Daily Eduard Clos, MD   81 mg at 04/25/21 0819   cefTRIAXone (ROCEPHIN) 2 g in sodium chloride 0.9 % 100 mL IVPB  2 g Intravenous Q24H Eduard Clos, MD 200 mL/hr at 04/25/21 1330 2 g at 04/25/21 1330   enoxaparin (LOVENOX) injection 40 mg  40 mg Subcutaneous Q24H Eduard Clos, MD   40 mg at 04/25/21 1329   guaiFENesin (MUCINEX) 12 hr tablet 600 mg  600 mg Oral BID Osvaldo Shipper, MD   600 mg at 04/25/21 0823   guaiFENesin (ROBITUSSIN) 100 MG/5ML liquid 5 mL  5 mL Oral Q4H PRN Osvaldo Shipper, MD       levalbuterol Pauline Aus) nebulizer solution 0.63 mg  0.63 mg Nebulization Q6H Osvaldo Shipper, MD   0.63 mg at 04/25/21 1404   metoprolol tartrate (LOPRESSOR) tablet 25 mg  25 mg Oral BID Eduard Clos, MD   25 mg at 04/25/21 8828   oseltamivir (TAMIFLU) capsule 30 mg  30 mg Oral BID Eduard Clos, MD   30 mg at 04/25/21 0034     Discharge Medications: Please see discharge summary for a list of discharge medications.  Relevant Imaging Results:  Relevant Lab Results:   Additional Information SSN [id number] Covid vaccinated x 2  Haidan Nhan P [place], LCSW

## 2021-04-25 NOTE — Progress Notes (Signed)
[facility] PROGRESS NOTE   Brooke Harrington [id number] DOB: 07/23/1940 DOA: 04/23/2021  PCP: Patient, No Pcp Per (Inactive)  Brief History/Interval Summary: 80 y.o. female with history of hypertension hyperlipidemia was brought to the ER after patient has been having cough congestion and symptoms of flu for the last 5 days.  Patient also over the last 2 days was noticed to have increasing slurring of speech and confusion.  She was noted to be seated desaturating in the emergency department.  Chest x-ray raised concern for pneumonia.  She was also noted to have brief runs of tachycardia concerning for SVT versus NSVT.  Patient was hospitalized for further management.   Reason for Visit: Pneumonia secondary to influenza  Consultants: None  Procedures: None    Subjective/Interval History: Patient noted to be more awake and alert this morning.  She denies any chest pain.  Shortness of breath is improving.  Occasional cough.  No nausea vomiting.      Assessment/Plan:  Pneumonia secondary to influenza and H influenzae/acute respiratory failure with hypoxia/sepsis present on admission/H influenzae bacteremia Patient was noted to be tachycardic, tachypneic, hypoxic and had elevated WBC. Patient placed on Tamiflu.  There was concern for secondary bacterial infection due to many days of symptoms.  She was placed on ceftriaxone and doxycycline.   She was noted to desaturate into the mid to late 80s.  Had to be placed on oxygen.   Currently on 2 L of oxygen via nasal cannula.  Saturations in the mid 90s. Blood cultures positive for H. influenzae.  Doxycycline was discontinued.  Continues to be on ceftriaxone.  WBC noted to be better today. Chest x-ray done on 12/25 raise concern for fluid overload.  She was given 1 dose of Lasix.  Cardiogram shows normal systolic function with grade 1 diastolic dysfunction. Hold off on further doses of furosemide. Nebulizer treatment with Xopenex  due to tachycardia.  Wheezing seems to be improving.  Hold off on steroids.  Unspecified tachyarrhythmia EKGs reviewed.  Most of them appears to show sinus tachycardia with PACs.  There is 1 which shows concern for nonsustained ventricular tachycardia though QRS was not that significantly wide.   Patient was placed back on her metoprolol.  TSH was noted to be normal.  Echocardiogram shows normal systolic function. Does not appear to have any other arrhythmias.  We will continue to monitor on telemetry.  Hold off on cardiology input for now.  Arrhythmia likely triggered by acute illness. Monitor electrolytes.  Replace potassium today.  Magnesium 2.2.  Normocytic anemia Drop in hemoglobin is dilutional.  We will check anemia panel tomorrow.  Elevated troponin Could be due to demand ischemia from infectious process.  Could also be from tachyarrhythmia.  BNP is noted to be mildly elevated.  Lasix x1 as discussed above.  Echocardiogram does not show any wall motion abnormalities.  Patient denies any chest pain.  Do not anticipate any further testing at this time.  Continue aspirin.  Consider outpatient follow-up with cardiology.  Acute metabolic encephalopathy Most likely due to infectious process.  CT head did not show any acute findings. MRI was poor study due to motion but no acute findings noted.  Patient does not have any focal neurological deficits  Hypoalbuminemia Likely due to poor nutritional status.  Consult nutritionist.  Mild hyponatremia Improved  Obesity Estimated body mass index is 32.29 kg/m as calculated from the following:   Height as of this encounter: 5' (1.524 m).   Weight as  of this encounter: 75 kg.   DVT Prophylaxis: Lovenox Code Status: Full code Family Communication: Discussed with patient.  No family at bedside. Disposition Plan: Involve PT and OT.  Hopefully return home when improved.  Status is: Inpatient  Remains inpatient appropriate because: Acute  respiratory failure with hypoxia, pneumonia     Medications: Scheduled:  aspirin EC  81 mg Oral Daily   enoxaparin (LOVENOX) injection  40 mg Subcutaneous Q24H   guaiFENesin  600 mg Oral BID   levalbuterol  0.63 mg Nebulization Q6H   metoprolol tartrate  25 mg Oral BID   oseltamivir  30 mg Oral BID   Continuous:  cefTRIAXone (ROCEPHIN)  IV 2 g (04/24/21 1558)   [id number]  Antibiotics: Anti-infectives (From admission, onward)    Start     Dose/Rate Route Frequency Ordered Stop   04/24/21 2200  vancomycin (VANCOREADY) IVPB 500 mg/100 mL  Status:  Discontinued        500 mg 100 mL/hr over 60 Minutes Intravenous Every 24 hours 04/23/21 2118 04/24/21 0402   04/24/21 1400  cefTRIAXone (ROCEPHIN) 2 g in sodium chloride 0.9 % 100 mL IVPB        2 g 200 mL/hr over 30 Minutes Intravenous Every 24 hours 04/24/21 0402 04/29/21 1359   04/24/21 1000  oseltamivir (TAMIFLU) capsule 30 mg        30 mg Oral 2 times daily 04/24/21 0409 04/28/21 2159   04/24/21 0500  doxycycline (VIBRAMYCIN) 100 mg in sodium chloride 0.9 % 250 mL IVPB  Status:  Discontinued        100 mg 125 mL/hr over 120 Minutes Intravenous Every 12 hours 04/24/21 0405 04/25/21 0535   04/23/21 2200  ceFEPIme (MAXIPIME) 2 g in sodium chloride 0.9 % 100 mL IVPB  Status:  Discontinued        2 g 200 mL/hr over 30 Minutes Intravenous Every 12 hours 04/23/21 2118 04/24/21 0402   04/23/21 2200  vancomycin (VANCOCIN) IVPB 1000 mg/200 mL premix        1,000 mg 200 mL/hr over 60 Minutes Intravenous  Once 04/23/21 2118 04/24/21 0157   04/23/21 2015  oseltamivir (TAMIFLU) capsule 75 mg        75 mg Oral  Once 04/23/21 2003 04/23/21 2228       Objective:  Vital Signs  Vitals:   04/24/21 2115 04/25/21 0455 04/25/21 0819 04/25/21 0919  BP:  (!) 148/64 (!) 146/69   Pulse: (!) 106 87 88 84  Resp:  18  16  Temp:  98.8 F (37.1 C)    TempSrc:  Oral    SpO2:  95%  96%  Weight:      Height:        Intake/Output Summary  (Last 24 hours) at 04/25/2021 1032 Last data filed at 04/25/2021 0500 Gross per 24 hour  Intake 894.16 ml  Output 1200 ml  Net -305.84 ml    Filed Weights   04/24/21 0149  Weight: 75 kg    General appearance: Awake alert.  In no distress Resp: Less tachypneic today.  No use of accessory muscles.  Coarse breath sounds with crackles bilateral bases.  No wheezing appreciated today. Cardio: S1-S2 is normal regular.  No S3-S4.  No rubs murmurs or bruit GI: Abdomen is soft.  Nontender nondistended.  Bowel sounds are present normal.  No masses organomegaly Extremities: No edema.  Moving all of her extremities Neurologic:  No focal neurological deficits.    Lab Results:  Data Reviewed: I have personally reviewed following labs and imaging studies  CBC: Recent Labs  Lab 04/23/21 1852 04/24/21 0559 04/25/21 0806  WBC 12.0* 16.9* 12.3*  NEUTROABS 10.6* 14.9*  --   HGB 12.6 12.0 10.7*  HCT 38.5 36.3 33.3*  MCV 85.2 86.4 86.0  PLT 278 273 220     Basic Metabolic Panel: Recent Labs  Lab 04/23/21 1852 04/23/21 2036 04/24/21 0559 04/25/21 0806  NA 132*  --  134* 136  K 3.0*  --  4.5 3.7  CL 94*  --  101 102  CO2 25  --  23 24  GLUCOSE 125*  --  112* 101*  BUN 54*  --  35* 25*  CREATININE 1.04*  --  0.75 0.56  CALCIUM 8.2*  --  8.3* 8.0*  MG  --  1.9 1.8 2.2     GFR: Estimated Creatinine Clearance: 50.7 mL/min (by C-G formula based on SCr of 0.56 mg/dL).  Liver Function Tests: Recent Labs  Lab 04/23/21 1852 04/24/21 0559  AST 39 27  ALT 30 26  ALKPHOS 73 71  BILITOT 0.8 1.0  PROT 6.6 5.7*  ALBUMIN 2.3* 1.8*     Thyroid Function Tests: Recent Labs    04/24/21 0559  TSH 0.719      Recent Results (from the past 240 hour(s))  Blood culture (routine x 2)     Status: None (Preliminary result)   Collection Time: 04/23/21  6:36 PM   Specimen: BLOOD  Result Value Ref Range Status   Specimen Description   Final    BLOOD LEFT ANTECUBITAL Performed at  [facility], [address] [place], [place] [address]    Special Requests   Final    BOTTLES DRAWN AEROBIC AND ANAEROBIC Blood Culture adequate volume Performed at [facility], [address] [place], [place] [address]    Culture   Final    NO GROWTH 2 DAYS Performed at [facility], [place] [address] [place], [place] [address]    Report Status PENDING  Incomplete  Blood culture (routine x 2)     Status: None (Preliminary result)   Collection Time: 04/23/21  6:49 PM   Specimen: BLOOD  Result Value Ref Range Status   Specimen Description   Final    BLOOD RIGHT ANTECUBITAL Performed at [facility], [address] [place], [place] [address]    Special Requests   Final    BOTTLES DRAWN AEROBIC AND ANAEROBIC Blood Culture adequate volume Performed at [facility], [address] [place], [place] [address]    Culture  Setup Time   Final    AEROBIC BOTTLE ONLY GRAM NEGATIVE COCCOBACILLI CRITICAL RESULT CALLED TO, READ BACK BY AND VERIFIED WITH: V BRYK PHARMD 04/25/21 0507 JDW    Culture   Final    CULTURE REINCUBATED FOR BETTER GROWTH Performed at [facility], [place] [address] [place], [place] [address]    Report Status PENDING  Incomplete  Blood Culture ID Panel (Reflexed)     Status: Abnormal   Collection Time: 04/23/21  6:49 PM  Result Value Ref Range Status   Enterococcus faecalis NOT DETECTED NOT DETECTED Final   Enterococcus Faecium NOT DETECTED NOT DETECTED Final   Listeria monocytogenes NOT DETECTED NOT DETECTED Final   Staphylococcus species NOT DETECTED NOT DETECTED Final   Staphylococcus aureus (BCID) NOT DETECTED NOT DETECTED Final   Staphylococcus epidermidis NOT  DETECTED NOT DETECTED Final   Staphylococcus lugdunensis NOT DETECTED NOT DETECTED Final   Streptococcus species NOT DETECTED NOT DETECTED Final   Streptococcus agalactiae NOT DETECTED NOT DETECTED Final   Streptococcus pneumoniae NOT DETECTED NOT  DETECTED Final   Streptococcus pyogenes NOT DETECTED NOT DETECTED Final   A.calcoaceticus-baumannii NOT DETECTED NOT DETECTED Final   Bacteroides fragilis NOT DETECTED NOT DETECTED Final   Enterobacterales NOT DETECTED NOT DETECTED Final   Enterobacter cloacae complex NOT DETECTED NOT DETECTED Final   Escherichia coli NOT DETECTED NOT DETECTED Final   Klebsiella aerogenes NOT DETECTED NOT DETECTED Final   Klebsiella oxytoca NOT DETECTED NOT DETECTED Final   Klebsiella pneumoniae NOT DETECTED NOT DETECTED Final   Proteus species NOT DETECTED NOT DETECTED Final   Salmonella species NOT DETECTED NOT DETECTED Final   Serratia marcescens NOT DETECTED NOT DETECTED Final   Haemophilus influenzae DETECTED (A) NOT DETECTED Final    Comment: CRITICAL RESULT CALLED TO, READ BACK BY AND VERIFIED WITH: V BRYK PHARMD 04/25/21 0507 JDW    Neisseria meningitidis NOT DETECTED NOT DETECTED Final   Pseudomonas aeruginosa NOT DETECTED NOT DETECTED Final   Stenotrophomonas maltophilia NOT DETECTED NOT DETECTED Final   Candida albicans NOT DETECTED NOT DETECTED Final   Candida auris NOT DETECTED NOT DETECTED Final   Candida glabrata NOT DETECTED NOT DETECTED Final   Candida krusei NOT DETECTED NOT DETECTED Final   Candida parapsilosis NOT DETECTED NOT DETECTED Final   Candida tropicalis NOT DETECTED NOT DETECTED Final   Cryptococcus neoformans/gattii NOT DETECTED NOT DETECTED Final    Comment: Performed at [facility], [address], [place], [place] [address]  Resp Panel by RT-PCR (Flu A&B, Covid) Nasopharyngeal Swab     Status: Abnormal   Collection Time: 04/23/21  6:52 PM   Specimen: Nasopharyngeal Swab; Nasopharyngeal(NP) swabs in vial transport medium  Result Value Ref Range Status   SARS Coronavirus 2 by RT PCR NEGATIVE NEGATIVE Final    Comment: (NOTE) SARS-CoV-2 target nucleic acids are NOT DETECTED.  The SARS-CoV-2 RNA is generally detectable in upper respiratory specimens during the  acute phase of infection. The lowest concentration of SARS-CoV-2 viral copies this assay can detect is 138 copies/mL. A negative result does not preclude SARS-Cov-2 infection and should not be used as the sole basis for treatment or other patient management decisions. A negative result may occur with  improper specimen collection/handling, submission of specimen other than nasopharyngeal swab, presence of viral mutation(s) within the areas targeted by this assay, and inadequate number of viral copies(<138 copies/mL). A negative result must be combined with clinical observations, patient history, and epidemiological information. The expected result is Negative.  Fact Sheet for Patients:  EntrepreneurPulse.com.au  Fact Sheet for Healthcare Providers:  IncredibleEmployment.be  This test is no t yet approved or cleared by the [place] FDA and  has been authorized for detection and/or diagnosis of SARS-CoV-2 by FDA under an Emergency Use Authorization (EUA). This EUA will remain  in effect (meaning this test can be used) for the duration of the COVID-19 declaration under Section 564(b)(1) of the Act, 21 U.S.C.section 360bbb-3(b)(1), unless the authorization is terminated  or revoked sooner.       Influenza A by PCR POSITIVE (A) NEGATIVE Final   Influenza B by PCR NEGATIVE NEGATIVE Final    Comment: (NOTE) The Xpert Xpress SARS-CoV-2/FLU/RSV plus assay is intended as an aid in the diagnosis of influenza from Nasopharyngeal swab specimens and should not be used as  a sole basis for treatment. Nasal washings and aspirates are unacceptable for Xpert Xpress SARS-CoV-2/FLU/RSV testing.  Fact Sheet for Patients: EntrepreneurPulse.com.au  Fact Sheet for Healthcare Providers: IncredibleEmployment.be  This test is not yet approved or cleared by the [place] FDA and has been authorized for detection and/or  diagnosis of SARS-CoV-2 by FDA under an Emergency Use Authorization (EUA). This EUA will remain in effect (meaning this test can be used) for the duration of the COVID-19 declaration under Section 564(b)(1) of the Act, 21 U.S.C. section 360bbb-3(b)(1), unless the authorization is terminated or revoked.  Performed at [facility], [address] [place], [place] [address]        Radiology Studies: CT Head Wo Contrast  Result Date: 04/23/2021 CLINICAL DATA:  Delirium EXAM: CT HEAD WITHOUT CONTRAST TECHNIQUE: Contiguous axial images were obtained from the base of the skull through the vertex without intravenous contrast. COMPARISON:  07/13/2007 FINDINGS: Brain: There is no mass, hemorrhage or extra-axial collection. There is generalized atrophy without lobar predilection. Hypodensity of the white matter is most commonly associated with chronic microvascular disease. Vascular: No abnormal hyperdensity of the major intracranial arteries or dural venous sinuses. No intracranial atherosclerosis. Skull: The visualized skull base, calvarium and extracranial soft tissues are normal. Sinuses/Orbits: Diffuse mild paranasal sinus mucosal thickening. The orbits are normal. IMPRESSION: Generalized atrophy and chronic microvascular ischemia without acute intracranial abnormality. Electronically Signed   By: Ulyses Jarred M.D.   On: 04/23/2021 21:24   MR BRAIN WO CONTRAST  Result Date: 04/24/2021 CLINICAL DATA:  80 year old female with altered mental status. EXAM: MRI HEAD WITHOUT CONTRAST TECHNIQUE: Multiplanar, multiecho pulse sequences of the brain and surrounding structures were obtained without intravenous contrast. COMPARISON:  Head CT 04/23/2021. FINDINGS: The examination had to be discontinued prior to completion due to patient altered mental status. DWI, sagittal T1 and axial T2 imaging only, which is intermittently degraded by motion. Brain: No restricted diffusion to suggest acute  infarction. No midline shift, mass effect, evidence of mass lesion, ventriculomegaly, extra-axial collection or acute intracranial hemorrhage. Cervicomedullary junction and pituitary are within normal limits. Vascular: Major intracranial vascular flow voids are preserved, dominant left vertebral artery suspected. Skull and upper cervical spine: Grossly negative. Sinuses/Orbits: Negative orbits. Widespread paranasal sinus fluid and mucosal thickening redemonstrated. Other: Mastoids are stable and well aerated. Grossly normal visible internal auditory structures. IMPRESSION: 1. Truncated and motion degraded exam. No acute intracranial abnormality. 2. Bilateral paranasal sinus inflammation. Electronically Signed   By: Genevie Ann M.D.   On: 04/24/2021 10:19   DG CHEST PORT 1 VIEW  Result Date: 04/24/2021 CLINICAL DATA:  Shortness of breath, weakness EXAM: PORTABLE CHEST 1 VIEW COMPARISON:  04/23/2021 FINDINGS: Heart is normal size. Diffuse interstitial prominence throughout the lungs, left greater than right. No effusions. No acute bony abnormality. IMPRESSION: Diffuse interstitial prominence throughout the lungs, left greater than right. This could reflect asymmetric edema or infection. Atypical pneumonia is possible. Electronically Signed   By: Rolm Baptise M.D.   On: 04/24/2021 07:47   DG Chest Portable 1 View  Result Date: 04/23/2021 CLINICAL DATA:  Pneumonia. EXAM: PORTABLE CHEST 1 VIEW COMPARISON:  Chest radiograph dated 07/13/2007. FINDINGS: There is diffuse chronic intra coarsening and bronchitic changes. Diffuse interstitial nodularity and areas of airspace density in the left mid to lower lung field concerning for pneumonia. No pleural effusion pneumothorax. The cardiac silhouette is within normal limits. Atherosclerotic calcification of the aorta. No acute osseous pathology. IMPRESSION: Findings concerning for pneumonia in  the left mid to lower lung field. Electronically Signed   By: Anner Crete  M.D.   On: 04/23/2021 20:56   ECHOCARDIOGRAM COMPLETE  Result Date: 04/24/2021    ECHOCARDIOGRAM REPORT   Patient Name:   Brooke Harrington Date of Exam: 04/24/2021 Medical Rec #:  [id number]      Height:       60.0 in Accession #:    [id number]     Weight:       165.3 lb Date of Birth:  Mar 21, 1941     BSA:          1.722 m Patient Age:    10 years       BP:           111/66 mmHg Patient Gender: F              HR:           91 bpm. Exam Location:  Inpatient Procedure: 2D Echo, Cardiac Doppler and Color Doppler Indications:    Ventricular Tachycardia I47.2  History:        Patient has no prior history of Echocardiogram examinations.                 Risk Factors:Hypertension.  Sonographer:    Alvino Chapel RCS Referring Phys: [place]  1. Left ventricular ejection fraction, by estimation, is 70 to 75%. The left ventricle has hyperdynamic function. The left ventricle has no regional wall motion abnormalities. There is mild left ventricular hypertrophy. Left ventricular diastolic parameters are consistent with Grade I diastolic dysfunction (impaired relaxation).  2. Right ventricular systolic function is normal. The right ventricular size is normal. Tricuspid regurgitation signal is inadequate for assessing PA pressure.  3. Left atrial size was upper normal.  4. Thee is a trivial pericardial effusion anterior to the right ventricle.  5. The mitral valve is abnormal. Trivial mitral valve regurgitation.  6. The aortic valve has an indeterminant number of cusps. There is moderate calcification of the aortic valve. Aortic valve regurgitation is mild. Possible mild aortic valve stenosis. Aortic valve mean gradient measures 8.0 mmHg. Dimenionless index 0.62.  7. The inferior vena cava is normal in size with greater than 50% respiratory variability, suggesting right atrial pressure of 3 mmHg. Comparison(s): No prior Echocardiogram. FINDINGS  Left Ventricle: Left ventricular ejection fraction, by  estimation, is 70 to 75%. The left ventricle has hyperdynamic function. The left ventricle has no regional wall motion abnormalities. The left ventricular internal cavity size was normal in size. There is mild left ventricular hypertrophy. Left ventricular diastolic parameters are consistent with Grade I diastolic dysfunction (impaired relaxation). Right Ventricle: The right ventricular size is normal. No increase in right ventricular wall thickness. Right ventricular systolic function is normal. Tricuspid regurgitation signal is inadequate for assessing PA pressure. Left Atrium: Left atrial size was upper normal. Right Atrium: Right atrial size was normal in size. Pericardium: Trivial pericardial effusion is present. The pericardial effusion is anterior to the right ventricle. Presence of epicardial fat layer. Mitral Valve: The mitral valve is abnormal. Mild mitral annular calcification. Trivial mitral valve regurgitation. Tricuspid Valve: The tricuspid valve is grossly normal. Tricuspid valve regurgitation is trivial. Aortic Valve: The aortic valve has an indeterminant number of cusps. There is moderate calcification of the aortic valve. Aortic valve regurgitation is mild. Mild aortic stenosis is present. Aortic valve mean gradient measures 8.0 mmHg. Aortic valve peak  gradient measures 16.6 mmHg. Aortic valve area, by  VTI measures 1.76 cm. Pulmonic Valve: The pulmonic valve was not well visualized. Pulmonic valve regurgitation is trivial. Aorta: The aortic root is normal in size and structure. Venous: The inferior vena cava is normal in size with greater than 50% respiratory variability, suggesting right atrial pressure of 3 mmHg. IAS/Shunts: The interatrial septum appears to be lipomatous. No atrial level shunt detected by color flow Doppler.  LEFT VENTRICLE PLAX 2D LVIDd:         3.90 cm   Diastology LVIDs:         2.80 cm   LV e' medial:    5.44 cm/s LV PW:         1.10 cm   LV E/e' medial:  19.1 LV IVS:         1.20 cm   LV e' lateral:   4.68 cm/s LVOT diam:     1.90 cm   LV E/e' lateral: 22.2 LV SV:         71 LV SV Index:   41 LVOT Area:     2.84 cm  RIGHT VENTRICLE RV S prime:     12.80 cm/s TAPSE (M-mode): 2.0 cm LEFT ATRIUM             Index        RIGHT ATRIUM           Index LA diam:        4.10 cm 2.38 cm/m   RA Area:     13.40 cm LA Vol (A2C):   66.0 ml 38.34 ml/m  RA Volume:   30.90 ml  17.95 ml/m LA Vol (A4C):   43.3 ml 25.15 ml/m LA Biplane Vol: 53.9 ml 31.31 ml/m  AORTIC VALVE AV Area (Vmax):    1.85 cm AV Area (Vmean):   2.03 cm AV Area (VTI):     1.76 cm AV Vmax:           203.50 cm/s AV Vmean:          132.500 cm/s AV VTI:            0.402 m AV Peak Grad:      16.6 mmHg AV Mean Grad:      8.0 mmHg LVOT Vmax:         133.00 cm/s LVOT Vmean:        95.100 cm/s LVOT VTI:          0.250 m LVOT/AV VTI ratio: 0.62  AORTA Ao Root diam: 3.00 cm MITRAL VALVE MV Area (PHT): 2.20 cm     SHUNTS MV Decel Time: 345 msec     Systemic VTI:  0.25 m MV E velocity: 104.00 cm/s  Systemic Diam: 1.90 cm MV A velocity: 132.00 cm/s MV E/A ratio:  0.79 Nona Dell MD Electronically signed by Nona Dell MD Signature Date/Time: 04/24/2021/5:41:26 PM    Final        LOS: 1 day   Osvaldo Shipper  [facility] Pager on [email or web address]  04/25/2021, 10:32 AM

## 2021-04-25 NOTE — Progress Notes (Signed)
PHARMACY - PHYSICIAN COMMUNICATION CRITICAL VALUE ALERT - BLOOD CULTURE IDENTIFICATION (BCID)  Brooke Harrington is an 80 y.o. female who presented to [facility] on 04/23/2021 with a chief complaint of weakness, AMS, cough, congestion.  Assessment:  Started on Tamiflu and Rocephin/doxycycline for both flu A and CAP, now growing H.flu in 1 of 4 blood cx bottles.  Name of physician (or Provider) Contacted: JGardner DO  Current antibiotics: Rocephin and doxycycline.  Changes to prescribed antibiotics recommended:  Recommendations accepted by provider -- narrow ABX to Rocephin; continue Tamiflu.  Results for orders placed or performed during the hospital encounter of 04/23/21  Blood Culture ID Panel (Reflexed) (Collected: 04/23/2021  6:49 PM)  Result Value Ref Range   Enterococcus faecalis NOT DETECTED NOT DETECTED   Enterococcus Faecium NOT DETECTED NOT DETECTED   Listeria monocytogenes NOT DETECTED NOT DETECTED   Staphylococcus species NOT DETECTED NOT DETECTED   Staphylococcus aureus (BCID) NOT DETECTED NOT DETECTED   Staphylococcus epidermidis NOT DETECTED NOT DETECTED   Staphylococcus lugdunensis NOT DETECTED NOT DETECTED   Streptococcus species NOT DETECTED NOT DETECTED   Streptococcus agalactiae NOT DETECTED NOT DETECTED   Streptococcus pneumoniae NOT DETECTED NOT DETECTED   Streptococcus pyogenes NOT DETECTED NOT DETECTED   A.calcoaceticus-baumannii NOT DETECTED NOT DETECTED   Bacteroides fragilis NOT DETECTED NOT DETECTED   Enterobacterales NOT DETECTED NOT DETECTED   Enterobacter cloacae complex NOT DETECTED NOT DETECTED   Escherichia coli NOT DETECTED NOT DETECTED   Klebsiella aerogenes NOT DETECTED NOT DETECTED   Klebsiella oxytoca NOT DETECTED NOT DETECTED   Klebsiella pneumoniae NOT DETECTED NOT DETECTED   Proteus species NOT DETECTED NOT DETECTED   Salmonella species NOT DETECTED NOT DETECTED   Serratia marcescens NOT DETECTED NOT DETECTED   Haemophilus influenzae  DETECTED (A) NOT DETECTED   Neisseria meningitidis NOT DETECTED NOT DETECTED   Pseudomonas aeruginosa NOT DETECTED NOT DETECTED   Stenotrophomonas maltophilia NOT DETECTED NOT DETECTED   Candida albicans NOT DETECTED NOT DETECTED   Candida auris NOT DETECTED NOT DETECTED   Candida glabrata NOT DETECTED NOT DETECTED   Candida krusei NOT DETECTED NOT DETECTED   Candida parapsilosis NOT DETECTED NOT DETECTED   Candida tropicalis NOT DETECTED NOT DETECTED   Cryptococcus neoformans/gattii NOT DETECTED NOT DETECTED    Vernard Gambles, PharmD, BCPS  04/25/2021  5:35 AM

## 2021-04-25 NOTE — Evaluation (Signed)
Physical Therapy Evaluation Patient Details Name: Brooke Harrington MRN: [id number] DOB: [birth year] Today's Date: 04/25/2021  History of Present Illness  Pt is an 80 y/o female admitted 12/24 secondary to AMS and weakness. Found to have PNA and the flu. PMH includes HTN.  Clinical Impression  Pt admitted secondary to problem above with deficits below. Pt unsteady and required mod A to stand and transfer to chair. Pt was previously independent and feel she would benefit from SNF level therapies at d/c. Will continue to follow acutely.        Recommendations for follow up therapy are one component of a multi-disciplinary discharge planning process, led by the attending physician.  Recommendations may be updated based on patient status, additional functional criteria and insurance authorization.  Follow Up Recommendations Skilled nursing-short term rehab (<3 hours/day)    Assistance Recommended at Discharge Frequent or constant Supervision/Assistance  Functional Status Assessment Patient has had a recent decline in their functional status and demonstrates the ability to make significant improvements in function in a reasonable and predictable amount of time.  Equipment Recommendations  Rolling walker (2 wheels);BSC/3in1    Recommendations for Other Services       Precautions / Restrictions Precautions Precautions: Fall Restrictions Weight Bearing Restrictions: No      Mobility  Bed Mobility Overal bed mobility: Needs Assistance Bed Mobility: Supine to Sit     Supine to sit: Mod assist     General bed mobility comments: Mod A for trunk and LE assist    Transfers Overall transfer level: Needs assistance Equipment used: 1 person hand held assist Transfers: Sit to/from Stand;Bed to chair/wheelchair/BSC Sit to Stand: Mod assist Stand pivot transfers: Mod assist         General transfer comment: Mod A for lift assist and steadying to stand and pivot to chair. Unsteady  throughout and pt reports feeling "unsure".    Ambulation/Gait                  Stairs            Wheelchair Mobility    Modified Rankin (Stroke Patients Only)       Balance Overall balance assessment: Needs assistance Sitting-balance support: No upper extremity supported Sitting balance-Leahy Scale: Fair     Standing balance support: Single extremity supported Standing balance-Leahy Scale: Poor Standing balance comment: Reliant on UE and external support                             Pertinent Vitals/Pain Pain Assessment: No/denies pain    Home Living Family/patient expects to be discharged to:: Private residence Living Arrangements: Children Available Help at Discharge: Family;Available PRN/intermittently Type of Home: House Home Access: Level entry       Home Layout: One level Home Equipment: None      Prior Function Prior Level of Function : Independent/Modified Independent                     Hand Dominance        Extremity/Trunk Assessment   Upper Extremity Assessment Upper Extremity Assessment: Defer to OT evaluation    Lower Extremity Assessment Lower Extremity Assessment: Generalized weakness    Cervical / Trunk Assessment Cervical / Trunk Assessment: Normal  Communication   Communication: Other (comment) (mumbled speech at times)  Cognition Arousal/Alertness: Awake/alert Behavior During Therapy: Anxious Overall Cognitive Status: No family/caregiver present to determine baseline cognitive functioning  General Comments: Slightly anxious with mobility. Some memory deficits noted.        General Comments General comments (skin integrity, edema, etc.): Nof amily present    Exercises     Assessment/Plan    PT Assessment Patient needs continued PT services  PT Problem List Decreased strength;Decreased activity tolerance;Decreased balance;Decreased  mobility;Decreased knowledge of use of DME;Decreased cognition;Decreased safety awareness;Decreased knowledge of precautions       PT Treatment Interventions DME instruction;Gait training;Therapeutic activities;Functional mobility training;Balance training;Therapeutic exercise;Patient/family education    PT Goals (Current goals can be found in the Care Plan section)  Acute Rehab PT Goals Patient Stated Goal: to go home PT Goal Formulation: With patient Time For Goal Achievement: 05/09/21 Potential to Achieve Goals: Good    Frequency Min 2X/week   Barriers to discharge        Co-evaluation               AM-PAC PT "6 Clicks" Mobility  Outcome Measure Help needed turning from your back to your side while in a flat bed without using bedrails?: A Lot Help needed moving from lying on your back to sitting on the side of a flat bed without using bedrails?: A Lot Help needed moving to and from a bed to a chair (including a wheelchair)?: A Lot Help needed standing up from a chair using your arms (e.g., wheelchair or bedside chair)?: A Lot Help needed to walk in hospital room?: Total Help needed climbing 3-5 steps with a railing? : Total 6 Click Score: 10    End of Session Equipment Utilized During Treatment: Gait belt Activity Tolerance: Patient tolerated treatment well Patient left: in chair;with call bell/phone within reach;with chair alarm set Nurse Communication: Mobility status PT Visit Diagnosis: Unsteadiness on feet (R26.81);Muscle weakness (generalized) (M62.81);Difficulty in walking, not elsewhere classified (R26.2)    Time: 2426-8341 PT Time Calculation (min) (ACUTE ONLY): 27 min   Charges:   PT Evaluation $PT Eval Moderate Complexity: 1 Mod PT Treatments $Therapeutic Activity: 8-22 mins        Cindee Salt, DPT  Acute Rehabilitation Services  Pager: [phone] Office: [phone]   Lehman Prom 04/25/2021, 12:28 PM

## 2021-04-25 NOTE — TOC Initial Note (Signed)
Transition of Care [facility]) - Initial/Assessment Note    Patient Details  Name: Brooke Harrington MRN: [id number] Date of Birth: 04/18/1941  [facility] of [facility]) CM/SW Contact:    Bethann Berkshire, [place] Phone Number: 04/25/2021, 3:37 PM  Clinical Narrative:                  CSW met with pt and pt son to discuss SNF recommendation. Pt lives at home with her adult son in [place]. CSW explains SNF process and medicare coverage. Pt and son are agreeable to SNF workup. Pt has had 2 covid vaccinations. CSW will complete FL2 and fax bed requests in hub.   Expected Discharge Plan: Skilled Nursing Facility Barriers to Discharge: Continued Medical Work up   Patient Goals and CMS Choice        Expected Discharge Plan and Services Expected Discharge Plan: [place] Acute Care Choice: [place] Living arrangements for the past 2 months: Single Family Home                                      Prior Living Arrangements/Services Living arrangements for the past 2 months: Single Family Home Lives with:: Adult Children          Need for Family Participation in Patient Care: Yes (Comment) Care giver support system in place?: Yes (comment)      Activities of Daily Living Home Assistive Devices/Equipment: None ADL Screening (condition at time of admission) Patient's cognitive ability adequate to safely complete daily activities?: Yes Is the patient deaf or have difficulty hearing?: No Does the patient have difficulty seeing, even when wearing glasses/contacts?: No Does the patient have difficulty concentrating, remembering, or making decisions?: No Patient able to express need for assistance with ADLs?: Yes Does the patient have difficulty dressing or bathing?: No Independently performs ADLs?: No Communication: Independent Dressing (OT): Independent Grooming: Independent Feeding: Independent Bathing: Independent Toileting:  Independent In/Out Bed: Needs assistance Is this a change from baseline?: Pre-admission baseline Walks in Home: Needs assistance Is this a change from baseline?: Pre-admission baseline Does the patient have difficulty walking or climbing stairs?: Yes Weakness of Legs: Both Weakness of Arms/Hands: None  Permission Sought/Granted   Permission granted to share information with : Yes, Verbal Permission Granted  Share Information with NAME: Podoll,Todd Son     [phone]           Emotional Assessment Appearance:: Appears stated age Attitude/Demeanor/Rapport: Guarded Affect (typically observed): Sad Orientation: : Oriented to Self, Oriented to Place, Oriented to  Time, Oriented to Situation Alcohol / Substance Use: Not Applicable Psych Involvement: No (comment)  Admission diagnosis:  Influenza A [J10.1] Nonsustained ventricular tachycardia [I47.29] Pneumonia of left lower lobe due to infectious organism [J18.9] CAP (community acquired pneumonia) [J18.9] Patient Active Problem List   Diagnosis Date Noted   Nonsustained ventricular tachycardia 04/24/2021   Pneumonia of left lower lobe due to infectious organism 04/24/2021   CAP (community acquired pneumonia) 04/24/2021   Influenza A 04/23/2021   PCP:  Patient, No Pcp Per (Inactive) Pharmacy:   [id number] [place]Phone: [phone]: [phone] [id number]: [phone]:  [phone]     Social Determinants of Health (SDOH) Interventions    Readmission Risk Interventions No flowsheet data found.

## 2021-04-26 DIAGNOSIS — I471 Supraventricular tachycardia: Secondary | ICD-10-CM | POA: Diagnosis not present

## 2021-04-26 DIAGNOSIS — J9601 Acute respiratory failure with hypoxia: Secondary | ICD-10-CM

## 2021-04-26 DIAGNOSIS — E440 Moderate protein-calorie malnutrition: Secondary | ICD-10-CM | POA: Insufficient documentation

## 2021-04-26 LAB — BASIC METABOLIC PANEL
Anion gap: 8 (ref 5–15)
BUN: 21 mg/dL (ref 8–23)
CO2: 24 mmol/L (ref 22–32)
Calcium: 8.3 mg/dL — ABNORMAL LOW (ref 8.9–10.3)
Chloride: 105 mmol/L (ref 98–111)
Creatinine, Ser: 0.62 mg/dL (ref 0.44–1.00)
GFR, Estimated: >60 mL/min (ref >60)
Glucose, Bld: 118 mg/dL — ABNORMAL HIGH (ref 70–99)
Potassium: 4.5 mmol/L (ref 3.5–5.1)
Sodium: 137 mmol/L (ref 135–145)

## 2021-04-26 LAB — RETICULOCYTES
Immature Retic Fract: 18.8 % — ABNORMAL HIGH (ref 2.3–15.9)
RBC.: 3.8 MIL/uL — ABNORMAL LOW (ref 3.87–5.11)
Retic Count, Absolute: 22.4 10*3/uL (ref 19.0–186.0)
Retic Ct Pct: 0.6 % (ref 0.4–3.1)

## 2021-04-26 LAB — CBC
HCT: 32 % — ABNORMAL LOW (ref 36.0–46.0)
Hemoglobin: 10.6 g/dL — ABNORMAL LOW (ref 12.0–15.0)
MCH: 28.3 pg (ref 26.0–34.0)
MCHC: 33.1 g/dL (ref 30.0–36.0)
MCV: 85.6 fL (ref 80.0–100.0)
Platelets: 244 10*3/uL (ref 150–400)
RBC: 3.74 MIL/uL — ABNORMAL LOW (ref 3.87–5.11)
RDW: 13.9 % (ref 11.5–15.5)
WBC: 13.3 10*3/uL — ABNORMAL HIGH (ref 4.0–10.5)
nRBC: 0 % (ref 0.0–0.2)

## 2021-04-26 LAB — IRON AND TIBC
Iron: 21 ug/dL — ABNORMAL LOW (ref 28–170)
Saturation Ratios: 11 % (ref 10.4–31.8)
TIBC: 185 ug/dL — ABNORMAL LOW (ref 250–450)
UIBC: 164 ug/dL

## 2021-04-26 LAB — FOLATE: Folate: 8.9 ng/mL (ref >5.9)

## 2021-04-26 LAB — LEGIONELLA PNEUMOPHILA SEROGP 1 UR AG: L. pneumophila Serogp 1 Ur Ag: NEGATIVE

## 2021-04-26 LAB — FERRITIN: Ferritin: 258 ng/mL (ref 11–307)

## 2021-04-26 LAB — VITAMIN B12: Vitamin B-12: 2656 pg/mL — ABNORMAL HIGH (ref 180–914)

## 2021-04-26 MED ORDER — SODIUM CHLORIDE 0.9 % IV BOLUS: 1000.0000 mL | Freq: Once | INTRAVENOUS | Status: DC

## 2021-04-26 MED ORDER — ADENOSINE 6 MG/2ML IV SOLN
INTRAVENOUS | Status: AC
  Filled 2021-04-26: qty 4

## 2021-04-26 MED ORDER — SENNOSIDES-DOCUSATE SODIUM 8.6-50 MG PO TABS
1.0000 | ORAL_TABLET | Freq: Every day | ORAL | Status: DC
Start: 2021-04-26 — End: 2021-04-29
  Administered 2021-04-26 – 2021-04-28 (×3): 1 via ORAL
  Filled 2021-04-26 (×3): qty 1

## 2021-04-26 MED ORDER — IPRATROPIUM-ALBUTEROL 0.5-2.5 (3) MG/3ML IN SOLN: 3.0000 mL | RESPIRATORY_TRACT | Status: DC | PRN

## 2021-04-26 MED ORDER — ENSURE ENLIVE PO LIQD
237.0000 mL | Freq: Two times a day (BID) | ORAL | Status: DC
  Administered 2021-04-27 – 2021-04-29 (×6): 237 mL via ORAL

## 2021-04-26 MED ORDER — LEVALBUTEROL HCL 0.63 MG/3ML IN NEBU
0.6300 mg | INHALATION_SOLUTION | Freq: Two times a day (BID) | RESPIRATORY_TRACT | Status: DC
  Administered 2021-04-26 – 2021-04-29 (×6): 0.63 mg via RESPIRATORY_TRACT
  Filled 2021-04-26 (×6): qty 3

## 2021-04-26 MED ORDER — METOPROLOL TARTRATE 5 MG/5ML IV SOLN
2.5000 mg | Freq: Once | INTRAVENOUS | Status: AC
  Administered 2021-04-26: 19:00:00 2.5 mg via INTRAVENOUS

## 2021-04-26 MED ORDER — ADENOSINE 6 MG/2ML IV SOLN
6.0000 mg | Freq: Once | INTRAVENOUS | Status: AC
  Administered 2021-04-26: 19:00:00 6 mg via INTRAVENOUS

## 2021-04-26 MED ORDER — ADULT MULTIVITAMIN W/MINERALS CH
1.0000 | ORAL_TABLET | Freq: Every day | ORAL | Status: DC
Start: 2021-04-26 — End: 2021-04-29
  Administered 2021-04-26 – 2021-04-29 (×4): 1 via ORAL
  Filled 2021-04-26 (×4): qty 1

## 2021-04-26 MED ORDER — SODIUM CHLORIDE 0.9 % IV SOLN
2.0000 g | INTRAVENOUS | Status: DC
  Administered 2021-04-26 – 2021-04-28 (×3): 2 g via INTRAVENOUS
  Filled 2021-04-26 (×4): qty 20

## 2021-04-26 MED ORDER — POLYETHYLENE GLYCOL 3350 17 G PO PACK
17.0000 g | PACK | Freq: Every day | ORAL | Status: DC
  Administered 2021-04-26 – 2021-04-28 (×3): 17 g via ORAL
  Filled 2021-04-26 (×4): qty 1

## 2021-04-26 MED ORDER — METOPROLOL TARTRATE 5 MG/5ML IV SOLN
INTRAVENOUS | Status: AC
  Filled 2021-04-26: qty 5

## 2021-04-26 MED ORDER — LEVALBUTEROL HCL 0.63 MG/3ML IN NEBU
0.6300 mg | INHALATION_SOLUTION | Freq: Three times a day (TID) | RESPIRATORY_TRACT | Status: DC
Start: 2021-04-26 — End: 2021-04-26
  Administered 2021-04-26: 08:00:00 0.63 mg via RESPIRATORY_TRACT
  Filled 2021-04-26: qty 3

## 2021-04-26 NOTE — Progress Notes (Addendum)
Initial Nutrition Assessment  DOCUMENTATION CODES:   Non-severe (moderate) malnutrition in context of acute illness/injury  INTERVENTION:   Ensure Enlive po BID, each supplement provides 350 kcal and 20 grams of protein.  MVI with minerals daily.  Change meal status to room service yes with assistance.  NUTRITION DIAGNOSIS:   Moderate Malnutrition related to acute illness (PNA d/t influenza) as evidenced by mild fat depletion, mild muscle depletion.  GOAL:   Patient will meet greater than or equal to 90% of their needs  MONITOR:   PO intake, Supplement acceptance, Labs  REASON FOR ASSESSMENT:   Consult Assessment of nutrition requirement/status  ASSESSMENT:   80 yo female admitted with PNA d/t influenza. Blood cultures positive for H. influenzae. PMH includes HTN, HLD.  Patient reports poor appetite and intake for the past week related to difficulty breathing with acute illness. She is unsure if she has lost any weight. She did not receive lunch today because she forgot to order it. Will change to room service yes with assistance to make sure she receives a meal even if she forgets to order. She agreed to try Ensure supplements between meals to help increase intake.   Weight history reviewed. No significant weight changes noted over the past year.   Labs reviewed.  Medications reviewed and include Miralax, Senokot-S, IV antibiotics.  NUTRITION - FOCUSED PHYSICAL EXAM:  Flowsheet Row Most Recent Value  Orbital Region Mild depletion  Upper Arm Region Mild depletion  Thoracic and Lumbar Region No depletion  Buccal Region No depletion  Temple Region Mild depletion  Clavicle Bone Region Mild depletion  Clavicle and Acromion Bone Region No depletion  Scapular Bone Region No depletion  Dorsal Hand Mild depletion  Patellar Region No depletion  Anterior Thigh Region No depletion  Posterior Calf Region No depletion  Edema (RD Assessment) Mild  Hair Reviewed  Eyes  Reviewed  Mouth Reviewed  Skin Reviewed  Nails Reviewed       Diet Order:   Diet Order             Diet Heart Room service appropriate? Yes with Assist; Fluid consistency: Thin  Diet effective now                   EDUCATION NEEDS:   Not appropriate for education at this time  Skin:  Skin Assessment: Reviewed RN Assessment  Last BM:  12/23  Height:   Ht Readings from Last 1 Encounters:  04/24/21 5' (1.524 m)    Weight:   Wt Readings from Last 1 Encounters:  04/24/21 75 kg    BMI:  Body mass index is 32.29 kg/m.  Estimated Nutritional Needs:   Kcal:  1600-1800  Protein:  80-95 gm  Fluid:  >/= 1.8 L   Gabriel Rainwater, RD, LDN, CNSC Please refer to Amion for contact information.

## 2021-04-26 NOTE — Progress Notes (Signed)
At 1834 Pm patient went into  SVT, pt had c/o of feeling her heart racing. Pt  Vital signs were stable. EKG initiated, Charge RN called to assist, who also called RRT to further assist. Dr. Rito Ehrlich also was notified and had initially prescribed for NS bolus and lopressor 2.5 mg IV push. Pt did not respond well to metoprolol, Dr, Jilda Panda notified by RRT  medication was not effective. Adenosine 6 mg IV push was ordered and given. Pt back to sinus rhythmn in the 90's to low 100's.Pt stated she feels better after her rhythm has gone back to Sinus Rhythmn.

## 2021-04-26 NOTE — Progress Notes (Signed)
TRH night coverage.  Pt seen at bedside at beginning of shift due to SVT with rate 203, SBP in the 70s, and distress.  Pt clearly unstable.  Initial EKG confirms SVT rate 203 with inferior and lateral ST depressions and TWI.  Pt given 6mg  IV adenosine with successful cardioversion initially to s.tach at 101, BP immediately improved to 150 systolic.  Immediate improvement in ST depressions on repeat EKG.  Pt now more comfortable, denies CP, watching TV: HR 96 Sinus.  BP 108/82.  Both EKGs placed in patient paper chart and are visible on Epic.  CRITICAL CARE Performed by: .   Total critical care time: 30 minutes  Critical care time was exclusive of separately billable procedures and treating other patients.  Critical care was necessary to treat or prevent imminent or life-threatening deterioration.  Critical care was time spent personally by me on the following activities: development of treatment plan with patient and/or surrogate as well as nursing, discussions with consultants, evaluation of patient's response to treatment, examination of patient, obtaining history from patient or surrogate, ordering and performing treatments and interventions, ordering and review of laboratory studies, ordering and review of radiographic studies, pulse oximetry and re-evaluation of patient's condition.

## 2021-04-26 NOTE — Progress Notes (Signed)
Inpatient Rehab Admissions Coordinator:   I spoke with pt. And daughter regarding CIR admit. They are agreeable. Likely admission for tomorrow.   Megan Salon, MS, CCC-SLP Rehab Admissions Coordinator  [phone] (celll) [phone] (office)

## 2021-04-26 NOTE — Evaluation (Signed)
Occupational Therapy Evaluation Patient Details Name: Brooke Harrington MRN: [id number] DOB: 12-Nov-1940 Today's Date: 04/26/2021   History of Present Illness Pt is an 80 y/o female admitted 12/24 secondary to AMS and weakness. Found to have PNA and the flu. PMH includes HTN.   Clinical Impression   PTA, pt lives with family and reports typically Independent with ADLs, mobility and some IADLs in the home. Pt presents now with deficits in cardiopulmonary tolerance, strength, and standing balance. Pt with new needs for supplemental O2 to maintain sats during activity (desats to low 80s on RA), as well as DME during mobility to maintain balance. Pt overall Min A for UB ADL, Max A for LB ADLs, and Min A for short mobility using RW though notably fatigued with all tasks. Pt endorses feeling weak and agreeable for ST rehab at SNF to decrease fall risk with daily tasks. Will continue to follow acutely to progress ADL endurance and independence.     Recommendations for follow up therapy are one component of a multi-disciplinary discharge planning process, led by the attending physician.  Recommendations may be updated based on patient status, additional functional criteria and insurance authorization.   Follow Up Recommendations  Skilled nursing-short term rehab (<3 hours/day)    Assistance Recommended at Discharge Frequent or constant Supervision/Assistance  Functional Status Assessment  Patient has had a recent decline in their functional status and demonstrates the ability to make significant improvements in function in a reasonable and predictable amount of time.  Equipment Recommendations  BSC/3in1;Other (comment) (Rolling walker)    Recommendations for Other Services       Precautions / Restrictions Precautions Precautions: Fall Precaution Comments: monitor O2 (does not wear at baseline) Restrictions Weight Bearing Restrictions: No      Mobility Bed Mobility Overal bed mobility: Needs  Assistance Bed Mobility: Supine to Sit     Supine to sit: Min assist;HOB elevated     General bed mobility comments: assist to lift trunk, cues to use bedrail    Transfers Overall transfer level: Needs assistance Equipment used: Rolling walker (2 wheels) Transfers: Sit to/from Stand Sit to Stand: Min assist                  Balance Overall balance assessment: Needs assistance Sitting-balance support: No upper extremity supported;Feet supported Sitting balance-Leahy Scale: Fair     Standing balance support: Single extremity supported Standing balance-Leahy Scale: Poor                             ADL either performed or assessed with clinical judgement   ADL Overall ADL's : Needs assistance/impaired Eating/Feeding: Set up;Sitting Eating/Feeding Details (indicate cue type and reason): to prepare coffee Grooming: Supervision/safety;Sitting   Upper Body Bathing: Minimal assistance;Sitting   Lower Body Bathing: Maximal assistance;Sit to/from stand Lower Body Bathing Details (indicate cue type and reason): to bathe peri region in standing after BM, limited standing endurance Upper Body Dressing : Minimal assistance;Sitting   Lower Body Dressing: Maximal assistance;Sit to/from stand   Toilet Transfer: Minimal assistance;Ambulation;Rolling walker (2 wheels)   Toileting- Clothing Manipulation and Hygiene: Maximal assistance;Sit to/from stand         General ADL Comments: New needs of supplemental O2 with activity, decreased standing endurance. pt endorses feeling weak     Vision Baseline Vision/History: 1 Wears glasses;4 Cataracts Ability to See in Adequate Light: 2 Moderately impaired Patient Visual Report: No change from baseline Vision Assessment?: Vision  impaired- to be further tested in functional context Additional Comments: cataracts, blurry vision     Perception     Praxis      Pertinent Vitals/Pain Pain Assessment: No/denies pain      Hand Dominance Right   Extremity/Trunk Assessment Upper Extremity Assessment Upper Extremity Assessment: Generalized weakness   Lower Extremity Assessment Lower Extremity Assessment: Defer to PT evaluation   Cervical / Trunk Assessment Cervical / Trunk Assessment: Normal   Communication Communication Communication: Other (comment) (mumbled speech at times)   Cognition Arousal/Alertness: Awake/alert Behavior During Therapy: Flat affect Overall Cognitive Status: No family/caregiver present to determine baseline cognitive functioning Area of Impairment: Attention;Memory;Following commands;Safety/judgement;Awareness;Problem solving                   Current Attention Level: Sustained Memory: Decreased short-term memory Following Commands: Follows one step commands with increased time Safety/Judgement: Decreased awareness of safety;Decreased awareness of deficits Awareness: Intellectual Problem Solving: Slow processing;Decreased initiation;Difficulty sequencing;Requires tactile cues;Requires verbal cues General Comments: pleasant, flat affect, follows directions consistently but slower processing and noted decreased awareness. limited insight into conditions (MD present reporting damage to lungs but when OT asked what MD said after they stepped out, reports he "says Im doing fine". also reports continent but noted with BM in bed on entry     General Comments  Spo2 > 90% on 2 L O2 and RA at rest and sitting EOB. desats to low 80s on RA with short mobility and required rest break, 1.5 min and placement of 2 L O2 to return to 90s    Exercises     Shoulder Instructions      Home Living Family/patient expects to be discharged to:: Private residence Living Arrangements: Children Available Help at Discharge: Family;Available PRN/intermittently Type of Home: House Home Access: Level entry     Home Layout: One level     Bathroom Shower/Tub: Contractor: Standard     Home Equipment: None          Prior Functioning/Environment Prior Level of Function : Independent/Modified Independent               ADLs Comments: soaks in tub, does laundry, can do simple meals but doesnt cook much. does not drive due to cataracts        OT Problem List: Decreased strength;Decreased activity tolerance;Impaired balance (sitting and/or standing);Decreased cognition;Cardiopulmonary status limiting activity;Decreased knowledge of use of DME or AE      OT Treatment/Interventions: Self-care/ADL training;Therapeutic exercise;Energy conservation;DME and/or AE instruction;Therapeutic activities;Patient/family education;Balance training    OT Goals(Current goals can be found in the care plan section) Acute Rehab OT Goals Patient Stated Goal: regain strength OT Goal Formulation: With patient Time For Goal Achievement: 05/10/21 Potential to Achieve Goals: Good  OT Frequency: Min 2X/week   Barriers to D/C:            Co-evaluation              AM-PAC OT "6 Clicks" Daily Activity     Outcome Measure Help from another person eating meals?: A Little Help from another person taking care of personal grooming?: A Little Help from another person toileting, which includes using toliet, bedpan, or urinal?: A Lot Help from another person bathing (including washing, rinsing, drying)?: A Lot Help from another person to put on and taking off regular upper body clothing?: A Little Help from another person to put on and taking off regular lower body clothing?: A Lot  6 Click Score: 15   End of Session Equipment Utilized During Treatment: Rolling walker (2 wheels);Oxygen Nurse Communication: Mobility status  Activity Tolerance: Patient tolerated treatment well Patient left: in chair;with call bell/phone within reach;with chair alarm set  OT Visit Diagnosis: Unsteadiness on feet (R26.81);Other abnormalities of gait and mobility (R26.89);Muscle  weakness (generalized) (M62.81)                Time: 8887-5797 OT Time Calculation (min): 25 min Charges:  OT General Charges $OT Visit: 1 Visit OT Evaluation $OT Eval Moderate Complexity: 1 Mod OT Treatments $Self Care/Home Management : 8-22 mins  Bradd Canary, OTR/L Acute Rehab Services Office: [phone]   Lorre Munroe 04/26/2021, 7:57 AM

## 2021-04-26 NOTE — Progress Notes (Addendum)
[facility] PROGRESS NOTE   Brooke Harrington VBT:[id number] DOB: 09-18-1940 DOA: 04/23/2021  PCP: Patient, No Pcp Per (Inactive)  Brief History/Interval Summary: 80 y.o. female with history of hypertension hyperlipidemia was brought to the ER after patient has been having cough congestion and symptoms of flu for the last 5 days.  Patient also over the last 2 days was noticed to have increasing slurring of speech and confusion.  She was noted to be seated desaturating in the emergency department.  Chest x-ray raised concern for pneumonia.  She was also noted to have brief runs of tachycardia concerning for SVT versus NSVT.  Patient was hospitalized for further management.   Started on antibacterials along with Tamiflu.  Noted to have H. influenzae bacteremia.  Improving slowly.  Will need to go to SNF when better.  Reason for Visit: Pneumonia secondary to influenza  Consultants: None  Procedures: None    Subjective/Interval History: Patient noted to be sitting up in the chair.  She was just transferred from the bed recently.  Noted to be slightly short of breath.  Denies any chest pain.  By the end of the visit her respiratory rate was much better.    Assessment/Plan:  Pneumonia secondary to influenza and H influenzae/acute respiratory failure with hypoxia/sepsis present on admission/H influenzae bacteremia Patient was noted to be tachycardic, tachypneic, hypoxic and had elevated WBC. Patient placed on Tamiflu.  There was concern for secondary bacterial infection due to many days of symptoms.  She was also placed on ceftriaxone and doxycycline.   She was noted to desaturate into the mid to late 80s.  Had to be placed on oxygen.   Blood cultures positive for H. influenzae.  Doxycycline was discontinued.  Continues to be on ceftriaxone.   Chest x-ray done on 12/25 raise concern for fluid overload.  She was given 1 dose of Lasix.  Echocardiogram shows normal systolic function with  grade 1 diastolic dysfunction. Hold off on further doses of furosemide. Patient's respiratory status noted to be stable.  Still requiring 2 L of oxygen but she is saturating in the mid to late 90s.  Respiratory effort has improved.  Nebulizer treatment as needed. Continue ceftriaxone and Tamiflu. WBC slightly better compared to admission but remains elevated.  Unspecified tachyarrhythmia EKGs reviewed.  Most of them appears to show sinus tachycardia with PACs.  There is one EKG which shows concern for SVT vs nonsustained ventricular tachycardia though QRS was not that significantly wide.   Patient was placed back on her metoprolol.  TSH was noted to be normal.  Echocardiogram shows normal systolic function. Electrolytes are corrected.  No arrhythmias noted on telemetry. Arrhythmia likely triggered by acute illness.  No further work-up or treatment at this time.  Continue beta-blocker.  Normocytic anemia Hemoglobin drop is likely dilutional.  No evidence of overt bleeding.  Anemia panel reviewed.  No clear-cut deficiencies identified.  Elevated troponin Could be due to demand ischemia from infectious process.  Could also be from tachyarrhythmia.  Was given Lasix x1.   Echocardiogram does not show any wall motion abnormalities.  Patient denies any chest pain.  Do not anticipate any further testing at this time.  Continue aspirin.  Consider outpatient follow-up with cardiology.  Acute metabolic encephalopathy Most likely due to infectious process.  CT head did not show any acute findings. MRI was poor study due to motion but no acute findings noted.  Patient does not have any focal neurological deficits.  Mentation seems to  be slowly improving.  Hypoalbuminemia Likely due to poor nutritional status.  Consult nutritionist.  Corrected calcium is 10.  Mild hyponatremia Resolved  Obesity Estimated body mass index is 32.29 kg/m as calculated from the following:   Height as of this encounter:  5' (1.524 m).   Weight as of this encounter: 75 kg.   DVT Prophylaxis: Lovenox Code Status: Full code Family Communication: Son was updated yesterday. Disposition Plan: SNF recommended by PT and OT.  Status is: Inpatient  Remains inpatient appropriate because: Acute respiratory failure with hypoxia, pneumonia     Medications: Scheduled:  aspirin EC  81 mg Oral Daily   enoxaparin (LOVENOX) injection  40 mg Subcutaneous Q24H   guaiFENesin  600 mg Oral BID   levalbuterol  0.63 mg Nebulization TID   metoprolol tartrate  25 mg Oral BID   oseltamivir  30 mg Oral BID   polyethylene glycol  17 g Oral Daily   senna-docusate  1 tablet Oral QHS   Continuous:  cefTRIAXone (ROCEPHIN)  IV 2 g (04/25/21 1330)   [id number]  Antibiotics: Anti-infectives (From admission, onward)    Start     Dose/Rate Route Frequency Ordered Stop   04/24/21 2200  vancomycin (VANCOREADY) IVPB 500 mg/100 mL  Status:  Discontinued        500 mg 100 mL/hr over 60 Minutes Intravenous Every 24 hours 04/23/21 2118 04/24/21 0402   04/24/21 1400  cefTRIAXone (ROCEPHIN) 2 g in sodium chloride 0.9 % 100 mL IVPB        2 g 200 mL/hr over 30 Minutes Intravenous Every 24 hours 04/24/21 0402 04/29/21 1359   04/24/21 1000  oseltamivir (TAMIFLU) capsule 30 mg        30 mg Oral 2 times daily 04/24/21 0409 04/28/21 2159   04/24/21 0500  doxycycline (VIBRAMYCIN) 100 mg in sodium chloride 0.9 % 250 mL IVPB  Status:  Discontinued        100 mg 125 mL/hr over 120 Minutes Intravenous Every 12 hours 04/24/21 0405 04/25/21 0535   04/23/21 2200  ceFEPIme (MAXIPIME) 2 g in sodium chloride 0.9 % 100 mL IVPB  Status:  Discontinued        2 g 200 mL/hr over 30 Minutes Intravenous Every 12 hours 04/23/21 2118 04/24/21 0402   04/23/21 2200  vancomycin (VANCOCIN) IVPB 1000 mg/200 mL premix        1,000 mg 200 mL/hr over 60 Minutes Intravenous  Once 04/23/21 2118 04/24/21 0157   04/23/21 2015  oseltamivir (TAMIFLU) capsule 75 mg         75 mg Oral  Once 04/23/21 2003 04/23/21 2228       Objective:  Vital Signs  Vitals:   04/25/21 2058 04/25/21 2106 04/26/21 0601 04/26/21 0806  BP: (!) 146/65  (!) 135/53   Pulse: 99  84   Resp:   18 16  Temp:   98.8 F (37.1 C)   TempSrc:   Oral   SpO2:  97% 97%   Weight:      Height:        Intake/Output Summary (Last 24 hours) at 04/26/2021 0952 Last data filed at 04/26/2021 0603 Gross per 24 hour  Intake --  Output 500 ml  Net -500 ml    Filed Weights   04/24/21 0149  Weight: 75 kg    General appearance: Awake alert.  In no distress.  Mildly distracted Resp: Normal effort noted.  Few crackles bilateral bases.  No wheezing or rhonchi.  Cardio: S1-S2 is normal regular.  No S3-S4.  No rubs murmurs or bruit GI: Abdomen is soft.  Nontender nondistended.  Bowel sounds are present normal.  No masses organomegaly Extremities: No edema.  Moving all 4 extremities Neurologic:  No focal neurological deficits.    Lab Results:  Data Reviewed: I have personally reviewed following labs and imaging studies  CBC: Recent Labs  Lab 04/23/21 1852 04/24/21 0559 04/25/21 0806 04/26/21 0332  WBC 12.0* 16.9* 12.3* 13.3*  NEUTROABS 10.6* 14.9*  --   --   HGB 12.6 12.0 10.7* 10.6*  HCT 38.5 36.3 33.3* 32.0*  MCV 85.2 86.4 86.0 85.6  PLT 278 273 220 244     Basic Metabolic Panel: Recent Labs  Lab 04/23/21 1852 04/23/21 2036 04/24/21 0559 04/25/21 0806 04/26/21 0332  NA 132*  --  134* 136 137  K 3.0*  --  4.5 3.7 4.5  CL 94*  --  101 102 105  CO2 25  --  23 24 24   GLUCOSE 125*  --  112* 101* 118*  BUN 54*  --  35* 25* 21  CREATININE 1.04*  --  0.75 0.56 0.62  CALCIUM 8.2*  --  8.3* 8.0* 8.3*  MG  --  1.9 1.8 2.2  --      GFR: Estimated Creatinine Clearance: 50.7 mL/min (by C-G formula based on SCr of 0.62 mg/dL).  Liver Function Tests: Recent Labs  Lab 04/23/21 1852 04/24/21 0559  AST 39 27  ALT 30 26  ALKPHOS 73 71  BILITOT 0.8 1.0  PROT  6.6 5.7*  ALBUMIN 2.3* 1.8*     Thyroid Function Tests: Recent Labs    04/24/21 0559  TSH 0.719      Recent Results (from the past 240 hour(s))  Blood culture (routine x 2)     Status: None (Preliminary result)   Collection Time: 04/23/21  6:36 PM   Specimen: BLOOD  Result Value Ref Range Status   Specimen Description   Final    BLOOD LEFT ANTECUBITAL Performed at [facility], [address] [place], [place] [address]    Special Requests   Final    BOTTLES DRAWN AEROBIC AND ANAEROBIC Blood Culture adequate volume Performed at [facility], [address], [place], [place] [address]    Culture   Final    NO GROWTH 3 DAYS Performed at [facility], [place] [address], [place], [place] [address]    Report Status PENDING  Incomplete  Blood culture (routine x 2)     Status: Abnormal (Preliminary result)   Collection Time: 04/23/21  6:49 PM   Specimen: BLOOD  Result Value Ref Range Status   Specimen Description   Final    BLOOD RIGHT ANTECUBITAL Performed at [facility], [address] [place], [place] [address]    Special Requests   Final    BOTTLES DRAWN AEROBIC AND ANAEROBIC Blood Culture adequate volume Performed at [facility], [address] [place], [place] [address]    Culture  Setup Time   Final    AEROBIC BOTTLE ONLY GRAM NEGATIVE COCCOBACILLI CRITICAL RESULT CALLED TO, READ BACK BY AND VERIFIED WITH: V BRYK PHARMD 04/25/21 0507 JDW    Culture (A)  Final    HAEMOPHILUS INFLUENZAE BETA LACTAMASE NEGATIVE HEALTH DEPARTMENT NOTIFIED Performed at [facility], [place] [address] [place],  [address]    Report Status PENDING  Incomplete  Blood Culture ID Panel (Reflexed)     Status: Abnormal   Collection Time: 04/23/21  6:49 PM  Result Value Ref Range Status   Enterococcus faecalis NOT DETECTED NOT DETECTED Final   Enterococcus Faecium NOT DETECTED NOT DETECTED Final   Listeria monocytogenes NOT  DETECTED NOT DETECTED Final   Staphylococcus species NOT DETECTED NOT DETECTED Final   Staphylococcus aureus (BCID) NOT DETECTED NOT DETECTED Final   Staphylococcus epidermidis NOT DETECTED NOT DETECTED Final   Staphylococcus lugdunensis NOT DETECTED NOT DETECTED Final   Streptococcus species NOT DETECTED NOT DETECTED Final   Streptococcus agalactiae NOT DETECTED NOT DETECTED Final   Streptococcus pneumoniae NOT DETECTED NOT DETECTED Final   Streptococcus pyogenes NOT DETECTED NOT DETECTED Final   A.calcoaceticus-baumannii NOT DETECTED NOT DETECTED Final   Bacteroides fragilis NOT DETECTED NOT DETECTED Final   Enterobacterales NOT DETECTED NOT DETECTED Final   Enterobacter cloacae complex NOT DETECTED NOT DETECTED Final   Escherichia coli NOT DETECTED NOT DETECTED Final   Klebsiella aerogenes NOT DETECTED NOT DETECTED Final   Klebsiella oxytoca NOT DETECTED NOT DETECTED Final   Klebsiella pneumoniae NOT DETECTED NOT DETECTED Final   Proteus species NOT DETECTED NOT DETECTED Final   Salmonella species NOT DETECTED NOT DETECTED Final   Serratia marcescens NOT DETECTED NOT DETECTED Final   Haemophilus influenzae DETECTED (A) NOT DETECTED Final    Comment: CRITICAL RESULT CALLED TO, READ BACK BY AND VERIFIED WITH: V BRYK PHARMD 04/25/21 0507 JDW    Neisseria meningitidis NOT DETECTED NOT DETECTED Final   Pseudomonas aeruginosa NOT DETECTED NOT DETECTED Final   Stenotrophomonas maltophilia NOT DETECTED NOT DETECTED Final   Candida albicans NOT DETECTED NOT DETECTED Final   Candida auris NOT DETECTED NOT DETECTED Final   Candida glabrata NOT DETECTED NOT DETECTED Final   Candida krusei NOT DETECTED NOT DETECTED Final   Candida parapsilosis NOT DETECTED NOT DETECTED Final   Candida tropicalis NOT DETECTED NOT DETECTED Final   Cryptococcus neoformans/gattii NOT DETECTED NOT DETECTED Final    Comment: Performed at [facility], [address], [place], [place] [address]  Resp Panel by  RT-PCR (Flu A&B, Covid) Nasopharyngeal Swab     Status: Abnormal   Collection Time: 04/23/21  6:52 PM   Specimen: Nasopharyngeal Swab; Nasopharyngeal(NP) swabs in vial transport medium  Result Value Ref Range Status   SARS Coronavirus 2 by RT PCR NEGATIVE NEGATIVE Final    Comment: (NOTE) SARS-CoV-2 target nucleic acids are NOT DETECTED.  The SARS-CoV-2 RNA is generally detectable in upper respiratory specimens during the acute phase of infection. The lowest concentration of SARS-CoV-2 viral copies this assay can detect is 138 copies/mL. A negative result does not preclude SARS-Cov-2 infection and should not be used as the sole basis for treatment or other patient management decisions. A negative result may occur with  improper specimen collection/handling, submission of specimen other than nasopharyngeal swab, presence of viral mutation(s) within the areas targeted by this assay, and inadequate number of viral copies(<138 copies/mL). A negative result must be combined with clinical observations, patient history, and epidemiological information. The expected result is Negative.  Fact Sheet for Patients:  BloggerCourse.com  Fact Sheet for Healthcare Providers:  SeriousBroker.it  This test is no t yet approved or cleared by the [place] FDA and  has been authorized for detection and/or diagnosis of SARS-CoV-2 by FDA under an Emergency Use Authorization (EUA). This EUA will remain  in effect (meaning this test can be used) for the duration  of the COVID-19 declaration under Section 564(b)(1) of the Act, 21 U.S.C.section 360bbb-3(b)(1), unless the authorization is terminated  or revoked sooner.       Influenza A by PCR POSITIVE (A) NEGATIVE Final   Influenza B by PCR NEGATIVE NEGATIVE Final    Comment: (NOTE) The Xpert Xpress SARS-CoV-2/FLU/RSV plus assay is intended as an aid in the diagnosis of influenza from Nasopharyngeal  swab specimens and should not be used as a sole basis for treatment. Nasal washings and aspirates are unacceptable for Xpert Xpress SARS-CoV-2/FLU/RSV testing.  Fact Sheet for Patients: EntrepreneurPulse.com.au  Fact Sheet for Healthcare Providers: IncredibleEmployment.be  This test is not yet approved or cleared by the [place] FDA and has been authorized for detection and/or diagnosis of SARS-CoV-2 by FDA under an Emergency Use Authorization (EUA). This EUA will remain in effect (meaning this test can be used) for the duration of the COVID-19 declaration under Section 564(b)(1) of the Act, 21 U.S.C. section 360bbb-3(b)(1), unless the authorization is terminated or revoked.  Performed at [facility], [address], [place], [place] [address]        Radiology Studies: MR BRAIN WO CONTRAST  Result Date: 04/24/2021 CLINICAL DATA:  80 year old female with altered mental status. EXAM: MRI HEAD WITHOUT CONTRAST TECHNIQUE: Multiplanar, multiecho pulse sequences of the brain and surrounding structures were obtained without intravenous contrast. COMPARISON:  Head CT 04/23/2021. FINDINGS: The examination had to be discontinued prior to completion due to patient altered mental status. DWI, sagittal T1 and axial T2 imaging only, which is intermittently degraded by motion. Brain: No restricted diffusion to suggest acute infarction. No midline shift, mass effect, evidence of mass lesion, ventriculomegaly, extra-axial collection or acute intracranial hemorrhage. Cervicomedullary junction and pituitary are within normal limits. Vascular: Major intracranial vascular flow voids are preserved, dominant left vertebral artery suspected. Skull and upper cervical spine: Grossly negative. Sinuses/Orbits: Negative orbits. Widespread paranasal sinus fluid and mucosal thickening redemonstrated. Other: Mastoids are stable and well aerated. Grossly normal  visible internal auditory structures. IMPRESSION: 1. Truncated and motion degraded exam. No acute intracranial abnormality. 2. Bilateral paranasal sinus inflammation. Electronically Signed   By: Genevie Ann M.D.   On: 04/24/2021 10:19   ECHOCARDIOGRAM COMPLETE  Result Date: 04/24/2021    ECHOCARDIOGRAM REPORT   Patient Name:   SUHAIL DEANDA Date of Exam: 04/24/2021 Medical Rec #:  [id number]      Height:       60.0 in Accession #:    [id number]     Weight:       165.3 lb Date of Birth:  1941-04-17     BSA:          1.722 m Patient Age:    62 years       BP:           111/66 mmHg Patient Gender: F              HR:           91 bpm. Exam Location:  Inpatient Procedure: 2D Echo, Cardiac Doppler and Color Doppler Indications:    Ventricular Tachycardia I47.2  History:        Patient has no prior history of Echocardiogram examinations.                 Risk Factors:Hypertension.  Sonographer:    Alvino Chapel RCS Referring Phys: [place]  1. Left ventricular ejection fraction, by estimation, is 70 to 75%. The  left ventricle has hyperdynamic function. The left ventricle has no regional wall motion abnormalities. There is mild left ventricular hypertrophy. Left ventricular diastolic parameters are consistent with Grade I diastolic dysfunction (impaired relaxation).  2. Right ventricular systolic function is normal. The right ventricular size is normal. Tricuspid regurgitation signal is inadequate for assessing PA pressure.  3. Left atrial size was upper normal.  4. Thee is a trivial pericardial effusion anterior to the right ventricle.  5. The mitral valve is abnormal. Trivial mitral valve regurgitation.  6. The aortic valve has an indeterminant number of cusps. There is moderate calcification of the aortic valve. Aortic valve regurgitation is mild. Possible mild aortic valve stenosis. Aortic valve mean gradient measures 8.0 mmHg. Dimenionless index 0.62.  7. The inferior vena cava is normal in  size with greater than 50% respiratory variability, suggesting right atrial pressure of 3 mmHg. Comparison(s): No prior Echocardiogram. FINDINGS  Left Ventricle: Left ventricular ejection fraction, by estimation, is 70 to 75%. The left ventricle has hyperdynamic function. The left ventricle has no regional wall motion abnormalities. The left ventricular internal cavity size was normal in size. There is mild left ventricular hypertrophy. Left ventricular diastolic parameters are consistent with Grade I diastolic dysfunction (impaired relaxation). Right Ventricle: The right ventricular size is normal. No increase in right ventricular wall thickness. Right ventricular systolic function is normal. Tricuspid regurgitation signal is inadequate for assessing PA pressure. Left Atrium: Left atrial size was upper normal. Right Atrium: Right atrial size was normal in size. Pericardium: Trivial pericardial effusion is present. The pericardial effusion is anterior to the right ventricle. Presence of epicardial fat layer. Mitral Valve: The mitral valve is abnormal. Mild mitral annular calcification. Trivial mitral valve regurgitation. Tricuspid Valve: The tricuspid valve is grossly normal. Tricuspid valve regurgitation is trivial. Aortic Valve: The aortic valve has an indeterminant number of cusps. There is moderate calcification of the aortic valve. Aortic valve regurgitation is mild. Mild aortic stenosis is present. Aortic valve mean gradient measures 8.0 mmHg. Aortic valve peak  gradient measures 16.6 mmHg. Aortic valve area, by VTI measures 1.76 cm. Pulmonic Valve: The pulmonic valve was not well visualized. Pulmonic valve regurgitation is trivial. Aorta: The aortic root is normal in size and structure. Venous: The inferior vena cava is normal in size with greater than 50% respiratory variability, suggesting right atrial pressure of 3 mmHg. IAS/Shunts: The interatrial septum appears to be lipomatous. No atrial level shunt  detected by color flow Doppler.  LEFT VENTRICLE PLAX 2D LVIDd:         3.90 cm   Diastology LVIDs:         2.80 cm   LV e' medial:    5.44 cm/s LV PW:         1.10 cm   LV E/e' medial:  19.1 LV IVS:        1.20 cm   LV e' lateral:   4.68 cm/s LVOT diam:     1.90 cm   LV E/e' lateral: 22.2 LV SV:         71 LV SV Index:   41 LVOT Area:     2.84 cm  RIGHT VENTRICLE RV S prime:     12.80 cm/s TAPSE (M-mode): 2.0 cm LEFT ATRIUM             Index        RIGHT ATRIUM           Index LA diam:  4.10 cm 2.38 cm/m   RA Area:     13.40 cm LA Vol (A2C):   66.0 ml 38.34 ml/m  RA Volume:   30.90 ml  17.95 ml/m LA Vol (A4C):   43.3 ml 25.15 ml/m LA Biplane Vol: 53.9 ml 31.31 ml/m  AORTIC VALVE AV Area (Vmax):    1.85 cm AV Area (Vmean):   2.03 cm AV Area (VTI):     1.76 cm AV Vmax:           203.50 cm/s AV Vmean:          132.500 cm/s AV VTI:            0.402 m AV Peak Grad:      16.6 mmHg AV Mean Grad:      8.0 mmHg LVOT Vmax:         133.00 cm/s LVOT Vmean:        95.100 cm/s LVOT VTI:          0.250 m LVOT/AV VTI ratio: 0.62  AORTA Ao Root diam: 3.00 cm MITRAL VALVE MV Area (PHT): 2.20 cm     SHUNTS MV Decel Time: 345 msec     Systemic VTI:  0.25 m MV E velocity: 104.00 cm/s  Systemic Diam: 1.90 cm MV A velocity: 132.00 cm/s MV E/A ratio:  0.79 Rozann Lesches MD Electronically signed by Rozann Lesches MD Signature Date/Time: 04/24/2021/5:41:26 PM    Final        LOS: 2 days   [facility] Pager on [email or web address]  04/26/2021, 9:52 AM

## 2021-04-26 NOTE — TOC Progression Note (Addendum)
Transition of Care [facility]) - Progression Note    Patient Details  Name: Brooke Harrington MRN: [id number] Date of Birth: 13-Sep-1940  Transition of Care [facility]) CM/SW [place], [place] Phone Number: 04/26/2021, 1:53 PM  Clinical Narrative:   CSW met with patient to discuss SNF offers. Patient requested that CSW contact her son to discuss. CSW called, left a voicemail, awaiting call back. CSW to follow.  UPDATE: CSW spoke with son, [name], and provided bed offers. Son to review options available and update CSW with choice. CSW to follow.    Expected Discharge Plan: [place] Barriers to Discharge: Continued Medical Work up  Expected Discharge Plan and Services Expected Discharge Plan: [place] Choice: [place] arrangements for the past 2 months: Single Family Home                                       Social Determinants of Health (SDOH) Interventions    Readmission Risk Interventions No flowsheet data found.

## 2021-04-26 NOTE — Progress Notes (Signed)
Inpatient Rehab Admissions Coordinator:  ? ?Per therapy recommendations,  patient was screened for CIR candidacy by Dannica Bickham, MS, CCC-SLP. At this time, Pt. Appears to be a a potential candidate for CIR. I will place   order for rehab consult per protocol for full assessment. Please contact me any with questions. ? ?Standley Bargo, MS, CCC-SLP ?Rehab Admissions Coordinator  ?[phone] (celll) ?[phone] (office) ? ?

## 2021-04-27 DIAGNOSIS — E440 Moderate protein-calorie malnutrition: Secondary | ICD-10-CM | POA: Diagnosis not present

## 2021-04-27 DIAGNOSIS — J189 Pneumonia, unspecified organism: Secondary | ICD-10-CM | POA: Diagnosis not present

## 2021-04-27 DIAGNOSIS — J101 Influenza due to other identified influenza virus with other respiratory manifestations: Secondary | ICD-10-CM | POA: Diagnosis not present

## 2021-04-27 DIAGNOSIS — R7881 Bacteremia: Secondary | ICD-10-CM | POA: Diagnosis not present

## 2021-04-27 LAB — BASIC METABOLIC PANEL WITH GFR
Anion gap: 9 (ref 5–15)
BUN: 15 mg/dL (ref 8–23)
CO2: 24 mmol/L (ref 22–32)
Calcium: 7.8 mg/dL — ABNORMAL LOW (ref 8.9–10.3)
Chloride: 102 mmol/L (ref 98–111)
Creatinine, Ser: 0.56 mg/dL (ref 0.44–1.00)
GFR, Estimated: >60 mL/min
Glucose, Bld: 109 mg/dL — ABNORMAL HIGH (ref 70–99)
Potassium: 4.2 mmol/L (ref 3.5–5.1)
Sodium: 135 mmol/L (ref 135–145)

## 2021-04-27 LAB — CBC
HCT: 31.7 % — ABNORMAL LOW (ref 36.0–46.0)
Hemoglobin: 10.2 g/dL — ABNORMAL LOW (ref 12.0–15.0)
MCH: 27.9 pg (ref 26.0–34.0)
MCHC: 32.2 g/dL (ref 30.0–36.0)
MCV: 86.8 fL (ref 80.0–100.0)
Platelets: 250 10*3/uL (ref 150–400)
RBC: 3.65 MIL/uL — ABNORMAL LOW (ref 3.87–5.11)
RDW: 14.1 % (ref 11.5–15.5)
WBC: 10.7 10*3/uL — ABNORMAL HIGH (ref 4.0–10.5)
nRBC: 0 % (ref 0.0–0.2)

## 2021-04-27 MED ORDER — SODIUM CHLORIDE 0.9 % IV BOLUS
250.0000 mL | Freq: Once | INTRAVENOUS | Status: AC
  Administered 2021-04-27: 11:00:00 250 mL via INTRAVENOUS

## 2021-04-27 MED ORDER — METOPROLOL TARTRATE 50 MG PO TABS
50.0000 mg | ORAL_TABLET | Freq: Two times a day (BID) | ORAL | Status: DC
  Administered 2021-04-27 – 2021-04-29 (×4): 50 mg via ORAL
  Filled 2021-04-27 (×4): qty 1

## 2021-04-27 NOTE — Progress Notes (Signed)
[facility] PROGRESS NOTE    Progress Note  [name]  NGE:[id number] DOB: [birth year] DOA: 04/23/2021 PCP: Patient, No Pcp Per (Inactive)     Brief Narrative:   Brooke Harrington is an 80 y.o. female past medical history of essential hypertension brought into the ED after having symptoms of cough and congestion 5 days prior to admission came into the ED was found to be hypoxic and confused chest x-ray showed pneumonia influenza PCR was positive she was started on Tamiflu she also had H. influenzae bacteremia and was started empirically on antibiotics    Assessment/Plan:   Acute respiratory failure with hypoxia secondarily to pneumonia secondary to influenza A and H influenza and sepsis present on admission/H influenza bacteremia: Started on Tamiflu on admission IV Rocephin and doxycycline due to her prolonged symptomatology. Blood cultures came back for H influenza Doxy has been discontinued, and continuing IV Rocephin. Due to not improving of her symptoms chest x-ray was done that showed fluid overloaded she was started on IV Lasix 1 to 2 days. Echocardiogram was done that showed a preserved EF with grade 1 diastolic heart failure. Her respiratory status improved. She remains on oxygen supplementation 2 L. Has remained afebrile leukocytosis improved.  Unspecified tachyarrhythmia/episode of SVT nonsustained overnight: Twelve-lead EKG shows sinus tachycardia with PACs, she was placed back on her metoprolol. Echocardiogram showed normal systolic function. Try to keep her potassium greater than 4 magnesium greater than 2. Arrhythmia likely triggered due to illness. Go ahead and increase her metoprolol  Normocytic anemia: Mild drop in hemoglobin likely dilutional no signs of overt bleeding.  Elevated troponins: In the setting of demand ischemia for infectious process. Continue aspirin will need to follow-up with cardiology as an outpatient.  Acute metabolic  encephalopathy: Likely due to infectious process, CT of the head showed no acute findings. MRI of the brain was a poor study but did not show any acute findings.  She remains neurologically intact mentation seems to be improving slowly.  Normocytic anemia: Likely due to poor nutritional status, nutritionist has been consulted.  Mild hyponatremia: Resolved.  Obesity: Noted      DVT prophylaxis: lovenox Family Communication:none Status is: Inpatient  Remains inpatient appropriate because: H influenza bacteremia      Code Status:     Code Status Orders  (From admission, onward)           Start     Ordered   04/24/21 0400  Full code  Continuous        04/24/21 0402           Code Status History     This patient has a current code status but no historical code status.         IV Access:   Peripheral IV   Procedures and diagnostic studies:   No results found.   Medical Consultants:   None.   Subjective:    [name] relates she feels tired and thirsty.  Objective:    Vitals:   04/26/21 2117 04/27/21 0455 04/27/21 0812 04/27/21 0855  BP: (!) 160/75 (!) 132/53  129/61  Pulse: 100 84  89  Resp:  18  18  Temp: 99.4 F (37.4 C) 98.4 F (36.9 C)  (!) 97.3 F (36.3 C)  TempSrc: Oral Oral  Oral  SpO2: 97% 94% 95% 95%  Weight:      Height:       SpO2: 95 % O2 Flow Rate (L/min): 2 L/min  Intake/Output Summary (Last 24 hours) at 04/27/2021 1036 Last data filed at 04/27/2021 0459 Gross per 24 hour  Intake --  Output 250 ml  Net -250 ml   Filed Weights   04/24/21 0149  Weight: 75 kg    Exam: General exam: In no acute distress. Respiratory system: Good air movement and clear to auscultation. Cardiovascular system: S1 & S2 heard, RRR. No JVD. Gastrointestinal system: Abdomen is nondistended, soft and nontender.  Extremities: No pedal edema. Skin: No rashes, lesions or ulcers Psychiatry: Judgement and insight appear  normal. Mood & affect appropriate.    Data Reviewed:    Labs: Basic Metabolic Panel: Recent Labs  Lab 04/23/21 1852 04/23/21 2036 04/24/21 0559 04/25/21 0806 04/26/21 0332 04/27/21 0430  NA 132*  --  134* 136 137 135  K 3.0*  --  4.5 3.7 4.5 4.2  CL 94*  --  101 102 105 102  CO2 25  --  23 24 24 24   GLUCOSE 125*  --  112* 101* 118* 109*  BUN 54*  --  35* 25* 21 15  CREATININE 1.04*  --  0.75 0.56 0.62 0.56  CALCIUM 8.2*  --  8.3* 8.0* 8.3* 7.8*  MG  --  1.9 1.8 2.2  --   --    GFR Estimated Creatinine Clearance: 50.7 mL/min (by C-G formula based on SCr of 0.56 mg/dL). Liver Function Tests: Recent Labs  Lab 04/23/21 1852 04/24/21 0559  AST 39 27  ALT 30 26  ALKPHOS 73 71  BILITOT 0.8 1.0  PROT 6.6 5.7*  ALBUMIN 2.3* 1.8*   No results for input(s): LIPASE, AMYLASE in the last 168 hours. No results for input(s): AMMONIA in the last 168 hours. Coagulation profile No results for input(s): INR, PROTIME in the last 168 hours. COVID-19 Labs  Recent Labs    04/26/21 0332  FERRITIN 258    Lab Results  Component Value Date   SARSCOV2NAA NEGATIVE 04/23/2021    CBC: Recent Labs  Lab 04/23/21 1852 04/24/21 0559 04/25/21 0806 04/26/21 0332 04/27/21 0430  WBC 12.0* 16.9* 12.3* 13.3* 10.7*  NEUTROABS 10.6* 14.9*  --   --   --   HGB 12.6 12.0 10.7* 10.6* 10.2*  HCT 38.5 36.3 33.3* 32.0* 31.7*  MCV 85.2 86.4 86.0 85.6 86.8  PLT 278 273 220 244 250   Cardiac Enzymes: No results for input(s): CKTOTAL, CKMB, CKMBINDEX, TROPONINI in the last 168 hours. BNP (last 3 results) No results for input(s): PROBNP in the last 8760 hours. CBG: No results for input(s): GLUCAP in the last 168 hours. D-Dimer: No results for input(s): DDIMER in the last 72 hours. Hgb A1c: No results for input(s): HGBA1C in the last 72 hours. Lipid Profile: No results for input(s): CHOL, HDL, LDLCALC, TRIG, CHOLHDL, LDLDIRECT in the last 72 hours. Thyroid function studies: No results for  input(s): TSH, T4TOTAL, T3FREE, THYROIDAB in the last 72 hours.  Invalid input(s): FREET3 Anemia work up: Recent Labs    04/26/21 0332  VITAMINB12 2,656*  FOLATE 8.9  FERRITIN 258  TIBC 185*  IRON 21*  RETICCTPCT 0.6   Sepsis Labs: Recent Labs  Lab 04/23/21 1852 04/23/21 2036 04/24/21 0559 04/24/21 0824 04/25/21 0806 04/26/21 0332 04/27/21 0430  WBC 12.0*  --  16.9*  --  12.3* 13.3* 10.7*  LATICACIDVEN 1.9 1.7 1.4 1.2  --   --   --    Microbiology Recent Results (from the past 240 hour(s))  Blood culture (routine x 2)  Status: None (Preliminary result)   Collection Time: 04/23/21  6:36 PM   Specimen: BLOOD  Result Value Ref Range Status   Specimen Description   Final    BLOOD LEFT ANTECUBITAL Performed at [facility], [address] [place], [place] [address]    Special Requests   Final    BOTTLES DRAWN AEROBIC AND ANAEROBIC Blood Culture adequate volume Performed at [facility], [address] [place], [place] [address]    Culture   Final    NO GROWTH 4 DAYS Performed at [facility], [address], [place], [place] [address]    Report Status PENDING  Incomplete  Blood culture (routine x 2)     Status: Abnormal (Preliminary result)   Collection Time: 04/23/21  6:49 PM   Specimen: BLOOD  Result Value Ref Range Status   Specimen Description   Final    BLOOD RIGHT ANTECUBITAL Performed at [facility], [address] [place], [place] [address]    Special Requests   Final    BOTTLES DRAWN AEROBIC AND ANAEROBIC Blood Culture adequate volume Performed at [facility], [address] [place], [place] [address]    Culture  Setup Time   Final    AEROBIC BOTTLE ONLY GRAM NEGATIVE COCCOBACILLI CRITICAL RESULT CALLED TO, READ BACK BY AND VERIFIED WITH: V BRYK PHARMD 04/25/21 0507 JDW    Culture (A)  Final    HAEMOPHILUS INFLUENZAE BETA LACTAMASE NEGATIVE HEALTH DEPARTMENT NOTIFIED Referred to [facility] in [place], [place] for serotyping. Performed at [facility], [address], [place], [place] [address]    Report Status PENDING  Incomplete  Blood Culture ID Panel (Reflexed)     Status: Abnormal   Collection Time: 04/23/21  6:49 PM  Result Value Ref Range Status   Enterococcus faecalis NOT DETECTED NOT DETECTED Final   Enterococcus Faecium NOT DETECTED NOT DETECTED Final   Listeria monocytogenes NOT DETECTED NOT DETECTED Final   Staphylococcus species NOT DETECTED NOT DETECTED Final   Staphylococcus aureus (BCID) NOT DETECTED NOT DETECTED Final   Staphylococcus epidermidis NOT DETECTED NOT DETECTED Final   Staphylococcus lugdunensis NOT DETECTED NOT DETECTED Final   Streptococcus species NOT DETECTED NOT DETECTED Final   Streptococcus agalactiae NOT DETECTED NOT DETECTED Final   Streptococcus pneumoniae NOT DETECTED NOT DETECTED Final   Streptococcus pyogenes NOT DETECTED NOT DETECTED Final   A.calcoaceticus-baumannii NOT DETECTED NOT DETECTED Final   Bacteroides fragilis NOT DETECTED NOT DETECTED Final   Enterobacterales NOT DETECTED NOT DETECTED Final   Enterobacter cloacae complex NOT DETECTED NOT DETECTED Final   Escherichia coli NOT DETECTED NOT DETECTED Final   Klebsiella aerogenes NOT DETECTED NOT DETECTED Final   Klebsiella oxytoca NOT DETECTED NOT DETECTED Final   Klebsiella pneumoniae NOT DETECTED NOT DETECTED Final   Proteus species NOT DETECTED NOT DETECTED Final   Salmonella species NOT DETECTED NOT DETECTED Final   Serratia marcescens NOT DETECTED NOT DETECTED Final   Haemophilus influenzae DETECTED (A) NOT DETECTED Final    Comment: CRITICAL RESULT CALLED TO, READ BACK BY AND VERIFIED WITH: V BRYK PHARMD 04/25/21 0507 JDW    Neisseria meningitidis NOT DETECTED NOT DETECTED Final   Pseudomonas aeruginosa NOT DETECTED NOT DETECTED Final   Stenotrophomonas maltophilia NOT DETECTED NOT DETECTED Final   Candida albicans NOT DETECTED NOT DETECTED Final    Candida auris NOT DETECTED NOT DETECTED Final   Candida glabrata  NOT DETECTED NOT DETECTED Final   Candida krusei NOT DETECTED NOT DETECTED Final   Candida parapsilosis NOT DETECTED NOT DETECTED Final   Candida tropicalis NOT DETECTED NOT DETECTED Final   Cryptococcus neoformans/gattii NOT DETECTED NOT DETECTED Final    Comment: Performed at [facility], [address] [place], [place] [address]  Resp Panel by RT-PCR (Flu A&B, Covid) Nasopharyngeal Swab     Status: Abnormal   Collection Time: 04/23/21  6:52 PM   Specimen: Nasopharyngeal Swab; Nasopharyngeal(NP) swabs in vial transport medium  Result Value Ref Range Status   SARS Coronavirus 2 by RT PCR NEGATIVE NEGATIVE Final    Comment: (NOTE) SARS-CoV-2 target nucleic acids are NOT DETECTED.  The SARS-CoV-2 RNA is generally detectable in upper respiratory specimens during the acute phase of infection. The lowest concentration of SARS-CoV-2 viral copies this assay can detect is 138 copies/mL. A negative result does not preclude SARS-Cov-2 infection and should not be used as the sole basis for treatment or other patient management decisions. A negative result may occur with  improper specimen collection/handling, submission of specimen other than nasopharyngeal swab, presence of viral mutation(s) within the areas targeted by this assay, and inadequate number of viral copies(<138 copies/mL). A negative result must be combined with clinical observations, patient history, and epidemiological information. The expected result is Negative.  Fact Sheet for Patients:  BloggerCourse.com  Fact Sheet for Healthcare Providers:  SeriousBroker.it  This test is no t yet approved or cleared by the [place] FDA and  has been authorized for detection and/or diagnosis of SARS-CoV-2 by FDA under an Emergency Use Authorization (EUA). This EUA will remain  in effect (meaning this test can  be used) for the duration of the COVID-19 declaration under Section 564(b)(1) of the Act, 21 U.S.C.section 360bbb-3(b)(1), unless the authorization is terminated  or revoked sooner.       Influenza A by PCR POSITIVE (A) NEGATIVE Final   Influenza B by PCR NEGATIVE NEGATIVE Final    Comment: (NOTE) The Xpert Xpress SARS-CoV-2/FLU/RSV plus assay is intended as an aid in the diagnosis of influenza from Nasopharyngeal swab specimens and should not be used as a sole basis for treatment. Nasal washings and aspirates are unacceptable for Xpert Xpress SARS-CoV-2/FLU/RSV testing.  Fact Sheet for Patients: BloggerCourse.com  Fact Sheet for Healthcare Providers: SeriousBroker.it  This test is not yet approved or cleared by the [place] FDA and has been authorized for detection and/or diagnosis of SARS-CoV-2 by FDA under an Emergency Use Authorization (EUA). This EUA will remain in effect (meaning this test can be used) for the duration of the COVID-19 declaration under Section 564(b)(1) of the Act, 21 U.S.C. section 360bbb-3(b)(1), unless the authorization is terminated or revoked.  Performed at [facility], [address] [place], [place] [address]      Medications:    aspirin EC  81 mg Oral Daily   enoxaparin (LOVENOX) injection  40 mg Subcutaneous Q24H   feeding supplement  237 mL Oral BID BM   guaiFENesin  600 mg Oral BID   levalbuterol  0.63 mg Nebulization BID   metoprolol tartrate  25 mg Oral BID   multivitamin with minerals  1 tablet Oral Daily   oseltamivir  30 mg Oral BID   polyethylene glycol  17 g Oral Daily   senna-docusate  1 tablet Oral QHS   Continuous Infusions:  cefTRIAXone (ROCEPHIN)  IV 2 g (04/26/21 1427)      LOS:  3 days   Brooke Harrington  [facility]  04/27/2021, 10:36 AM

## 2021-04-27 NOTE — Progress Notes (Signed)
Pt refused to get in chair stating " I hate getting in that chair. I don't want to do it." Pt reeducated. Still refusing. MD aware. Normal Saline bolus infusing as per MAR. Call bell placed within reach. Will continue to monitor and maintain safety.

## 2021-04-27 NOTE — Care Management Important Message (Signed)
Important Message  Patient Details  Name: Brooke Harrington MRN: [id number] Date of Birth: [birth year]   Medicare Important Message Given:  Yes     Renie Ora 04/27/2021, 11:14 AM

## 2021-04-27 NOTE — Progress Notes (Signed)
Inpatient Rehab Admissions Coordinator:   Acute MD requests to hold d/c to CIR. I will follow up tomorrow for potential admit.   Megan Salon, MS, CCC-SLP Rehab Admissions Coordinator  [phone] (celll) [phone] (office)

## 2021-04-27 NOTE — H&P (Signed)
Physical Medicine and Rehabilitation Admission H&P    Chief Complaint  Patient presents with   Weakness  : HPI: Brooke Harrington is a 80 year old right-handed female with history of hypertension, hyperlipidemia.  Per chart review patient lives with her children.  1 level home level entry.  Modified independent prior to admission.  She can do simple meal preparations and ADLs.  Presented 04/23/2021 with increasing cough x2 days and mild slurring of speech.  CT/MRI showed no acute intracranial abnormality.  Chest x-ray concerning for pneumonia in left mid to lower lung field.  Admission chemistry sodium 132 potassium 3.0 chloride 94 glucose 125 BUN 54 creatinine 1.04, lactic acid 1.9, WBC 12,000 blood cultures haemophilus influenza detected, BNP 369, troponin 452-416.  Patient was placed on Tamiflu.  There was concern for secondary bacterial infection due to many days of symptoms placed on ceftriaxone as well as doxycycline changed to Augmentin 04/29/2021.  Echocardiogram ejection fraction of 70 to 75% no wall motion abnormalities grade 1 diastolic dysfunction.  Lovenox added for DVT prophylaxis.  Hospital course bouts of SVT with rate [id number] systolic blood pressure in the 70s she did initially receive IV adenosine with successful cardioversion and currently remains on Lopressor.  Tolerating a regular diet.  Therapy evaluations completed due to patient decreased functional mobility was admitted for a comprehensive rehab program.  Review of Systems  Constitutional:  Positive for malaise/fatigue. Negative for chills and fever.  HENT:  Negative for hearing loss.   Eyes:  Negative for blurred vision and double vision.  Respiratory:  Positive for cough and shortness of breath. Negative for wheezing.   Cardiovascular:  Positive for palpitations and leg swelling. Negative for chest pain.  Gastrointestinal:  Positive for constipation. Negative for nausea and vomiting.  Genitourinary:  Negative for dysuria,  flank pain and hematuria.  Musculoskeletal:  Positive for joint pain and myalgias.  Skin:  Negative for rash.  Neurological:  Positive for weakness.  All other systems reviewed and are negative. Past Medical History:  Diagnosis Date   Hypertension    Past Surgical History:  Procedure Laterality Date   HAND SURGERY     History reviewed. No pertinent family history. Social History:  reports that she has never smoked. She has never used smokeless tobacco. She reports current alcohol use. She reports that she does not use drugs. Allergies: No Known Allergies Medications Prior to Admission  Medication Sig Dispense Refill   acetaminophen (TYLENOL) 500 MG tablet Take 500 mg by mouth every 6 (six) hours as needed for mild pain, fever or headache.     ibuprofen (ADVIL) 200 MG tablet Take 600 mg by mouth every 6 (six) hours as needed for fever, headache or mild pain.      Drug Regimen Review Drug regimen was reviewed and remains appropriate with no significant issues identified.  Home: Home Living Family/patient expects to be discharged to:: Private residence Living Arrangements: Children Available Help at Discharge: Family, Available PRN/intermittently Type of Home: House Home Access: Level entry [place]: One level Bathroom Shower/Tub: Chiropodist: Standard Home Equipment: None   Functional History: Prior Function Prior Level of Function : Independent/Modified Independent ADLs Comments: soaks in tub, does laundry, can do simple meals but doesnt cook much. does not drive due to cataracts  Functional Status:  Mobility: Bed Mobility Overal bed mobility: Needs Assistance Bed Mobility: Supine to Sit Supine to sit: Min assist, HOB elevated General bed mobility comments: assist to lift trunk, cues to use  bedrail Transfers Overall transfer level: Needs assistance Equipment used: Rolling walker (2 wheels) Transfers: Sit to/from Stand Sit to Stand: Min  assist Bed to/from chair/wheelchair/BSC transfer type:: Stand pivot Stand pivot transfers: Mod assist General transfer comment: Mod A for lift assist and steadying to stand and pivot to chair. Unsteady throughout and pt reports feeling "unsure".      ADL: ADL Overall ADL's : Needs assistance/impaired Eating/Feeding: Set up, Sitting Eating/Feeding Details (indicate cue type and reason): to prepare coffee Grooming: Supervision/safety, Sitting Upper Body Bathing: Minimal assistance, Sitting Lower Body Bathing: Maximal assistance, Sit to/from stand Lower Body Bathing Details (indicate cue type and reason): to bathe peri region in standing after BM, limited standing endurance Upper Body Dressing : Minimal assistance, Sitting Lower Body Dressing: Maximal assistance, Sit to/from stand Toilet Transfer: Minimal assistance, Ambulation, Rolling walker (2 wheels) Toileting- Clothing Manipulation and Hygiene: Maximal assistance, Sit to/from stand General ADL Comments: New needs of supplemental O2 with activity, decreased standing endurance. pt endorses feeling weak  Cognition: Cognition Overall Cognitive Status: No family/caregiver present to determine baseline cognitive functioning Orientation Level: Oriented X4 Cognition Arousal/Alertness: Awake/alert Behavior During Therapy: Flat affect Overall Cognitive Status: No family/caregiver present to determine baseline cognitive functioning Area of Impairment: Attention, Memory, Following commands, Safety/judgement, Awareness, Problem solving Current Attention Level: Sustained Memory: Decreased short-term memory Following Commands: Follows one step commands with increased time Safety/Judgement: Decreased awareness of safety, Decreased awareness of deficits Awareness: Intellectual Problem Solving: Slow processing, Decreased initiation, Difficulty sequencing, Requires tactile cues, Requires verbal cues General Comments: pleasant, flat affect,  follows directions consistently but slower processing and noted decreased awareness. limited insight into conditions (MD present reporting damage to lungs but when OT asked what MD said after they stepped out, reports he "says Im doing fine". also reports continent but noted with BM in bed on entry  Physical Exam: Blood pressure (!) 132/53, pulse 84, temperature 98.4 F (36.9 C), temperature source Oral, resp. rate 18, height 5' (1.524 m), weight 75 kg, SpO2 94 %. Physical Exam Constitutional:      General: She is not in acute distress.    Appearance: She is ill-appearing.  HENT:     Right Ear: External ear normal.     Left Ear: External ear normal.     Mouth/Throat:     Mouth: Mucous membranes are moist.  Eyes:     Extraocular Movements: Extraocular movements intact.     Pupils: Pupils are equal, round, and reactive to light.  Cardiovascular:     Rate and Rhythm: Normal rate and regular rhythm.     Heart sounds: No murmur heard.   No gallop.  Pulmonary:     Effort: Pulmonary effort is normal. No respiratory distress.     Breath sounds: Rhonchi present. No wheezing.     Comments: O2 [place]  Abdominal:     General: There is no distension.     Palpations: Abdomen is soft.     Tenderness: There is no abdominal tenderness.  Musculoskeletal:     Right lower leg: Edema present.     Left lower leg: Edema present.  Skin:    General: Skin is warm.     Comments: Chronic changes in LE's, mild.   Neurological:     Comments: Patient is alert.  Sitting up in chair.  Oriented x3 and follows commands. Some delays in processing. Speech dysarthric/low volume. Able to provide biographical information. UE Motor 4/5. LE: 3/5 prox to 4/5 distally. No focal sensory abnl. No  limb ataxia  Psychiatric:     Comments: Flat, slow to engage but cooperative.    Results for orders placed or performed during the hospital encounter of 04/23/21 (from the past 48 hour(s))  CBC     Status: Abnormal   Collection  Time: 04/25/21  8:06 AM  Result Value Ref Range   WBC 12.3 (H) 4.0 - 10.5 K/uL   RBC 3.87 3.87 - 5.11 MIL/uL   Hemoglobin 10.7 (L) 12.0 - 15.0 g/dL   HCT 33.3 (L) 36.0 - 46.0 %   MCV 86.0 80.0 - 100.0 fL   MCH 27.6 26.0 - 34.0 pg   MCHC 32.1 30.0 - 36.0 g/dL   RDW 14.0 11.5 - 15.5 %   Platelets 220 150 - 400 K/uL   nRBC 0.0 0.0 - 0.2 %    Comment: Performed at [facility], [place] [address], [place], [place] [id number]  Basic metabolic panel     Status: Abnormal   Collection Time: 04/25/21  8:06 AM  Result Value Ref Range   Sodium 136 135 - 145 mmol/L   Potassium 3.7 3.5 - 5.1 mmol/L   Chloride 102 98 - 111 mmol/L   CO2 24 22 - 32 mmol/L   Glucose, Bld 101 (H) 70 - 99 mg/dL    Comment: Glucose reference range applies only to samples taken after fasting for at least 8 hours.   BUN 25 (H) 8 - 23 mg/dL   Creatinine, Ser 0.56 0.44 - 1.00 mg/dL   Calcium 8.0 (L) 8.9 - 10.3 mg/dL   GFR, Estimated >60 >60 mL/min    Comment: (NOTE) Calculated using the CKD-EPI Creatinine Equation (2021)    Anion gap 10 5 - 15    Comment: Performed at [facility] [address], [place], [place] [address]  Magnesium     Status: None   Collection Time: 04/25/21  8:06 AM  Result Value Ref Range   Magnesium 2.2 1.7 - 2.4 mg/dL    Comment: Performed at [facility] [address] [place], [place] [address]  CBC     Status: Abnormal   Collection Time: 04/26/21  3:32 AM  Result Value Ref Range   WBC 13.3 (H) 4.0 - 10.5 K/uL   RBC 3.74 (L) 3.87 - 5.11 MIL/uL   Hemoglobin 10.6 (L) 12.0 - 15.0 g/dL   HCT 32.0 (L) 36.0 - 46.0 %   MCV 85.6 80.0 - 100.0 fL   MCH 28.3 26.0 - 34.0 pg   MCHC 33.1 30.0 - 36.0 g/dL   RDW 13.9 11.5 - 15.5 %   Platelets 244 150 - 400 K/uL   nRBC 0.0 0.0 - 0.2 %    Comment: Performed at [facility], [place] [address], [place], [place] [id number]  Basic metabolic panel     Status: Abnormal   Collection Time: 04/26/21  3:32 AM  Result Value Ref Range    Sodium 137 135 - 145 mmol/L   Potassium 4.5 3.5 - 5.1 mmol/L    Comment: DELTA CHECK NOTED   Chloride 105 98 - 111 mmol/L   CO2 24 22 - 32 mmol/L   Glucose, Bld 118 (H) 70 - 99 mg/dL    Comment: Glucose reference range applies only to samples taken after fasting for at least 8 hours.   BUN 21 8 - 23 mg/dL   Creatinine, Ser 0.62 0.44 - 1.00 mg/dL   Calcium 8.3 (L) 8.9 - 10.3 mg/dL  GFR, Estimated >60 >60 mL/min    Comment: (NOTE) Calculated using the CKD-EPI Creatinine Equation (2021)    Anion gap 8 5 - 15    Comment: Performed at [facility], [address] [place], [place] [address]  Vitamin B12     Status: Abnormal   Collection Time: 04/26/21  3:32 AM  Result Value Ref Range   Vitamin B-12 2,656 (H) 180 - 914 pg/mL    Comment: (NOTE) This assay is not validated for testing neonatal or myeloproliferative syndrome specimens for Vitamin B12 levels. Performed at [facility], [address] [place], [place] [address]   Folate     Status: None   Collection Time: 04/26/21  3:32 AM  Result Value Ref Range   Folate 8.9 >5.9 ng/mL    Comment: Performed at [facility], [address] [place], [place] [address]  Iron and TIBC     Status: Abnormal   Collection Time: 04/26/21  3:32 AM  Result Value Ref Range   Iron 21 (L) 28 - 170 ug/dL   TIBC 660 (L) 630 - 160 ug/dL   Saturation Ratios 11 10.4 - 31.8 %   UIBC 164 ug/dL    Comment: Performed at [facility], [address] [place], [place] [address]  Ferritin     Status: None   Collection Time: 04/26/21  3:32 AM  Result Value Ref Range   Ferritin 258 11 - 307 ng/mL    Comment: Performed at [facility], [address] [place], [place] [address]  Reticulocytes     Status: Abnormal   Collection Time: 04/26/21  3:32 AM  Result Value Ref Range   Retic Ct Pct 0.6 0.4 - 3.1 %   RBC. 3.80 (L) 3.87 - 5.11 MIL/uL   Retic Count, Absolute 22.4 19.0 - 186.0 K/uL   Immature Retic Fract 18.8 (H) 2.3 -  15.9 %    Comment: Performed at [facility], [address], [place], [place] [address]  CBC     Status: Abnormal   Collection Time: 04/27/21  4:30 AM  Result Value Ref Range   WBC 10.7 (H) 4.0 - 10.5 K/uL   RBC 3.65 (L) 3.87 - 5.11 MIL/uL   Hemoglobin 10.2 (L) 12.0 - 15.0 g/dL   HCT 42.7 (L) 06.2 - 37.6 %   MCV 86.8 80.0 - 100.0 fL   MCH 27.9 26.0 - 34.0 pg   MCHC 32.2 30.0 - 36.0 g/dL   RDW 28.3 15.1 - 76.1 %   Platelets 250 150 - 400 K/uL   nRBC 0.0 0.0 - 0.2 %    Comment: Performed at [facility], [address], [place], [place] [address]  Basic metabolic panel     Status: Abnormal   Collection Time: 04/27/21  4:30 AM  Result Value Ref Range   Sodium 135 135 - 145 mmol/L   Potassium 4.2 3.5 - 5.1 mmol/L   Chloride 102 98 - 111 mmol/L   CO2 24 22 - 32 mmol/L   Glucose, Bld 109 (H) 70 - 99 mg/dL    Comment: Glucose reference range applies only to samples taken after fasting for at least 8 hours.   BUN 15 8 - 23 mg/dL   Creatinine, Ser 1.06 0.44 - 1.00 mg/dL   Calcium 7.8 (L) 8.9 - 10.3 mg/dL   GFR, Estimated >26 >94 mL/min    Comment: (NOTE) Calculated using the CKD-EPI Creatinine  Equation (2021)    Anion gap 9 5 - 15    Comment: Performed at [facility], [place] [address] [place], [place] [address]   No results found.     Medical Problem List and Plan: 1. Functional deficits. debility secondary to pneumonia/acute respiratory failure.  Pt also with resolving metabolic encephalopathy  -patient may shower  -ELOS/Goals: 7-10 days/ mod I for basic mobility and self-care 2.  Antithrombotics: -DVT/anticoagulation:  Pharmaceutical: Lovenox  -antiplatelet therapy: Aspirin 81 mg daily 3. Pain Management: Tylenol as needed 4. Mood: Not emotional support  -antipsychotic agents: N/A 5. Neuropsych: This patient is capable of making decisions on her own behalf. 6. Skin/Wound Care: Routine skin checks 7. Fluids/Electrolytes/Nutrition: Routine in and outs  with follow-up chemistries 8.  SVT.  Patient did initially require IV adenosine successful cardioversion.  Continue Lopressor 50 mg twice daily. Cardiology services follow-up  -monitor HR with activities 9.  Constipation.  MiraLAX 10. Pneumonia: complete course of tamiflu and augmentin  -wean from oxygen as able  -IS, OOB     Cathlyn Parsons, PA-C 04/27/2021

## 2021-04-27 NOTE — PMR Pre-admission (Signed)
PMR Admission Coordinator Pre-Admission Assessment  Patient: Brooke Harrington is an 80 y.o., female MRN: [id number] DOB: 07/05/1940 Height: 5' (152.4 cm) Weight: 75 kg  Insurance Information HMO:     PPO:      PCP:      IPA:      80/20: yes     OTHER:  PRIMARY: Medicare A B      Policy#:   [id number]    Subscriber: Pt. Phone#: Verified online    Fax#:  Pre-Cert#:       Employer:  Benefits:  Phone #:      Name:  Eff. Date: Parts A ad B effective 05/01/2020  Deduct: $1556      Out of Pocket Max:  None      Life Max: N/A  CIR: 100%      SNF: 100 days Outpatient: 80%     Co-Pay: 20% Home Health: 100%      Co-Pay: none DME: 80%     Co-Pay: 20% Providers: patient's choice SECONDARY: none      Policy#:      Phone#:   Financial Counselor:       Phone#:   The Actuary for patients in Inpatient Rehabilitation Facilities with attached Privacy Act [place] Records was provided and verbally reviewed with: Patient  Emergency Contact Information Contact Information     Name Relation Home Work Mobile   Clayton Daughter   [phone]   Nickie, [place]   [phone]       Current Medical History  Patient Admitting Diagnosis: Respiratory Failure History of Present Illness: Brooke Harrington is a 80 year old right-handed female with history of hypertension, hyperlipidemia.  Per chart review patient lives with her children.  1 level home level entry.  Modified independent prior to admission.  She can do simple meal preparations and ADLs.  Presented 04/23/2021 with increasing cough x2 days and mild slurring of speech.  CT/MRI showed no acute intracranial abnormality.  Chest x-ray concerning for pneumonia in left mid to lower lung field.  Admission chemistry sodium 132 potassium 3.0 chloride 94 glucose 125 BUN 54 creatinine 1.04, lactic acid 1.9, WBC 12,000 blood cultures haemophilus influenza detected, BNP 369, troponin 452-416.  Patient was placed  on Tamiflu.  There was concern for secondary bacterial infection due to many days of symptoms placed on ceftriaxone as well as doxycycline.  Echocardiogram ejection fraction of 70 to 75% no wall motion abnormalities grade 1 diastolic dysfunction.  Lovenox added for DVT prophylaxis.  Hospital course bouts of SVT with rate 166 systolic blood pressure in the 70s she did initially receive IV adenosine with successful cardioversion and currently remains on Lopressor.  Tolerating a regular diet.  Therapy evaluations completed due to patient decreased functional mobility was admitted for a comprehensive rehab program.    Patient's medical record from [facility] has been reviewed by the rehabilitation admission coordinator and physician.  Past Medical History  Past Medical History:  Diagnosis Date   Hypertension     Has the patient had major surgery during 100 days prior to admission? No  Family History   family history is not on file.  Current Medications  Current Facility-Administered Medications:    aspirin EC tablet 81 mg, 81 mg, Oral, Daily, Rise Patience, MD, 81 mg at 04/26/21 1033   cefTRIAXone (ROCEPHIN) 2 g in sodium chloride 0.9 % 100 mL IVPB, 2 g, Intravenous, Q24H, Bonnielee Haff, MD, Last Rate: 200 mL/hr at 04/26/21  1427, 2 g at 04/26/21 1427   enoxaparin (LOVENOX) injection 40 mg, 40 mg, Subcutaneous, Q24H, Rise Patience, MD, 40 mg at 04/26/21 1427   feeding supplement (ENSURE ENLIVE / ENSURE PLUS) liquid 237 mL, 237 mL, Oral, BID BM, Bonnielee Haff, MD   guaiFENesin (MUCINEX) 12 hr tablet 600 mg, 600 mg, Oral, BID, Bonnielee Haff, MD, 600 mg at 04/26/21 2117   guaiFENesin (ROBITUSSIN) 100 MG/5ML liquid 5 mL, 5 mL, Oral, Q4H PRN, Bonnielee Haff, MD   ipratropium-albuterol (DUONEB) 0.5-2.5 (3) MG/3ML nebulizer solution 3 mL, 3 mL, Nebulization, Q4H PRN, Bonnielee Haff, MD   levalbuterol Penne Lash) nebulizer solution 0.63 mg, 0.63 mg, Nebulization,  BID, Bonnielee Haff, MD, 0.63 mg at 04/27/21 1245   metoprolol tartrate (LOPRESSOR) tablet 25 mg, 25 mg, Oral, BID, Rise Patience, MD, 25 mg at 04/26/21 2117   multivitamin with minerals tablet 1 tablet, 1 tablet, Oral, Daily, Bonnielee Haff, MD, 1 tablet at 04/26/21 1427   oseltamivir (TAMIFLU) capsule 30 mg, 30 mg, Oral, BID, Rise Patience, MD, 30 mg at 04/26/21 2117   polyethylene glycol (MIRALAX / GLYCOLAX) packet 17 g, 17 g, Oral, Daily, Bonnielee Haff, MD, 17 g at 04/26/21 1033   senna-docusate (Senokot-S) tablet 1 tablet, 1 tablet, Oral, QHS, Bonnielee Haff, MD, 1 tablet at 04/26/21 2117  Patients Current Diet:  Diet Order             Diet Heart Room service appropriate? Yes with Assist; Fluid consistency: Thin  Diet effective now                   Precautions / Restrictions Precautions Precautions: Fall Precaution Comments: monitor O2 (does not wear at baseline) Restrictions Weight Bearing Restrictions: No   Has the patient had 2 or more falls or a fall with injury in the past year? No  Prior Activity Level Limited Community (1-2x/wk): Pt. went out about 1x a week  Prior Functional Level Self Care: Did the patient need help bathing, dressing, using the toilet or eating? Independent  Indoor Mobility: Did the patient need assistance with walking from room to room (with or without device)? Independent  Stairs: Did the patient need assistance with internal or external stairs (with or without device)? Independent  Functional Cognition: Did the patient need help planning regular tasks such as shopping or remembering to take medications? Needed some help  Patient Information Are you of Hispanic, Latino/a,or Spanish origin?: A. No, not of Hispanic, Latino/a, or Spanish origin What is your race?: A. White Do you need or want an interpreter to communicate with a doctor or health care staff?: 0. No  Patient's Response To:  Health Literacy and  Transportation Is the patient able to respond to health literacy and transportation needs?: Yes Health Literacy - How often do you need to have someone help you when you read instructions, pamphlets, or other written material from your doctor or pharmacy?: Often In the past 12 months, has lack of transportation kept you from medical appointments or from getting medications?: No In the past 12 months, has lack of transportation kept you from meetings, work, or from getting things needed for daily living?: No  Development worker, international aid / [place] Devices/Equipment: None Home Equipment: None  Prior Device Use: Indicate devices/aids used by the patient prior to current illness, exacerbation or injury? None of the above  Current Functional Level Cognition  Overall Cognitive Status: No family/caregiver present to determine baseline cognitive functioning Current Attention Level: Sustained  Orientation Level: Oriented X4 Following Commands: Follows one step commands with increased time Safety/Judgement: Decreased awareness of safety, Decreased awareness of deficits General Comments: pleasant, flat affect, follows directions consistently but slower processing and noted decreased awareness. limited insight into conditions (MD present reporting damage to lungs but when OT asked what MD said after they stepped out, reports he "says Im doing fine". also reports continent but noted with BM in bed on entry    Extremity Assessment (includes Sensation/Coordination)  Upper Extremity Assessment: Generalized weakness  Lower Extremity Assessment: Defer to PT evaluation    ADLs  Overall ADL's : Needs assistance/impaired Eating/Feeding: Set up, Sitting Eating/Feeding Details (indicate cue type and reason): to prepare coffee Grooming: Supervision/safety, Sitting Upper Body Bathing: Minimal assistance, Sitting Lower Body Bathing: Maximal assistance, Sit to/from stand Lower Body Bathing Details  (indicate cue type and reason): to bathe peri region in standing after BM, limited standing endurance Upper Body Dressing : Minimal assistance, Sitting Lower Body Dressing: Maximal assistance, Sit to/from stand Toilet Transfer: Minimal assistance, Ambulation, Rolling walker (2 wheels) Toileting- Clothing Manipulation and Hygiene: Maximal assistance, Sit to/from stand General ADL Comments: New needs of supplemental O2 with activity, decreased standing endurance. pt endorses feeling weak    Mobility  Overal bed mobility: Needs Assistance Bed Mobility: Supine to Sit Supine to sit: Min assist, HOB elevated General bed mobility comments: assist to lift trunk, cues to use bedrail    Transfers  Overall transfer level: Needs assistance Equipment used: Rolling walker (2 wheels) Transfers: Sit to/from Stand Sit to Stand: Min assist Bed to/from chair/wheelchair/BSC transfer type:: Stand pivot Stand pivot transfers: Mod assist General transfer comment: Mod A for lift assist and steadying to stand and pivot to chair. Unsteady throughout and pt reports feeling "unsure".    Ambulation / Gait / Stairs / Office manager / Balance Balance Overall balance assessment: Needs assistance Sitting-balance support: No upper extremity supported, Feet supported Sitting balance-Leahy Scale: Fair Standing balance support: Single extremity supported Standing balance-Leahy Scale: Poor Standing balance comment: Reliant on UE and external support    Special needs/care consideration Skin abrasion on upper back, ecchymosis on bilateral hands. Pt. On IV Rocephin   Previous Home Environment (from acute therapy documentation) Living Arrangements: Children Available Help at Discharge: Family, Available PRN/intermittently Type of Home: House Home Layout: One level Home Access: Level entry Bathroom Shower/Tub: Chiropodist: [place]: No  Discharge Living  Setting Plans for Discharge Living Setting: Patient's home Type of Home at Discharge: House Discharge Home Layout: One level Discharge Home Access: Level entry Discharge Bathroom Shower/Tub: Tub/shower unit Discharge Bathroom Toilet: Standard Discharge Bathroom Accessibility: No Does the patient have any problems obtaining your medications?: No  Social/Family/Support Systems Patient Roles: Other (Comment) Contact Information: [phone] Anticipated Caregiver: Mariany Mackintosh (son) Anticipated Caregiver's Contact Information: [phone] Ability/Limitations of Caregiver: Can provide min A Caregiver Availability: Evenings only Discharge Plan Discussed with Primary Caregiver: Yes Is Caregiver In Agreement with Plan?: Yes Does Caregiver/Family have Issues with Lodging/Transportation while Pt is in Rehab?: No  Goals Patient/Family Goal for Rehab: PT/OT Mod I Expected length of stay: 7-10 days Pt/Family Agrees to Admission and willing to participate: Yes Program Orientation Provided & Reviewed with Pt/Caregiver Including Roles  & Responsibilities: Yes  Decrease burden of Care through IP rehab admission: Specialzed equipment needs, Decrease number of caregivers, Bowel and bladder program, and Patient/family education  Possible need for SNF placement upon discharge: not anticipated  Patient Condition: I have reviewed medical records from [facility] , spoken with CM, and patient and family member. I met with patient at the bedside for inpatient rehabilitation assessment.  Patient will benefit from ongoing PT, OT, and SLP, can actively participate in 3 hours of therapy a day 5 days of the week, and can make measurable gains during the admission.  Patient will also benefit from the coordinated team approach during an Inpatient Acute Rehabilitation admission.  The patient will receive intensive therapy as well as Rehabilitation physician, nursing, social worker, and care  management interventions.  Due to safety, skin/wound care, disease management, medication administration, pain management, and patient education the patient requires 24 hour a day rehabilitation nursing.  The patient is currently min A with mobility and basic ADLs.  Discharge setting and therapy post discharge at home with home health is anticipated.  Patient has agreed to participate in the Acute Inpatient Rehabilitation Program and will admit today.  Preadmission Screen Completed By:  Genella Mech, 04/27/2021 8:36 AM ______________________________________________________________________   Discussed status with Dr. Naaman Plummer on 04/29/21 at 70 and received approval for admission today.  Admission Coordinator:  Genella Mech, CCC-SLP, time 1000/Date 04/29/21   Assessment/Plan: Diagnosis: debility after respiratory failure Does the need for close, 24 hr/day Medical supervision in concert with the patient's rehab needs make it unreasonable for this patient to be served in a less intensive setting? Yes Co-Morbidities requiring supervision/potential complications: HTN, pneumonia Due to bladder management, bowel management, safety, skin/wound care, disease management, medication administration, pain management, and patient education, does the patient require 24 hr/day rehab nursing? Yes Does the patient require coordinated care of a physician, rehab nurse, PT, OT to address physical and functional deficits in the context of the above medical diagnosis(es)? Yes Addressing deficits in the following areas: balance, endurance, locomotion, strength, transferring, bowel/bladder control, bathing, dressing, feeding, grooming, toileting, and psychosocial support Can the patient actively participate in an intensive therapy program of at least 3 hrs of therapy 5 days a week? Yes The potential for patient to make measurable gains while on inpatient rehab is excellent Anticipated functional outcomes upon discharge  from inpatient rehab: modified independent PT, modified independent OT, n/a SLP Estimated rehab length of stay to reach the above functional goals is: 7-10 days Anticipated discharge destination: Home 10. Overall Rehab/Functional Prognosis: excellent   MD Signature: Meredith Staggers, MD, [place] Director Rehabilitation Services 04/29/2021

## 2021-04-27 NOTE — Plan of Care (Signed)
°  Problem: Education: Goal: Knowledge of General Education information will improve Description: Including pain rating scale, medication(s)/side effects and non-pharmacologic comfort measures Outcome: Progressing   Problem: Health Behavior/Discharge Planning: Goal: Ability to manage health-related needs will improve Outcome: Progressing   Problem: Clinical Measurements: Goal: Ability to maintain clinical measurements within normal limits will improve Outcome: Progressing Goal: Cardiovascular complication will be avoided Outcome: Progressing   Problem: Clinical Measurements: Goal: Cardiovascular complication will be avoided Outcome: Progressing   Problem: Activity: Goal: Risk for activity intolerance will decrease Outcome: Not Progressing   Problem: Activity: Goal: Risk for activity intolerance will decrease Outcome: Not Progressing   Problem: Activity: Goal: Risk for activity intolerance will decrease Outcome: Not Progressing Patient refused to get out of bed today Problem: Nutrition: Goal: Adequate nutrition will be maintained Outcome: Not Progressing Patient did not eat lunch nor dinner

## 2021-04-28 DIAGNOSIS — J101 Influenza due to other identified influenza virus with other respiratory manifestations: Secondary | ICD-10-CM | POA: Diagnosis not present

## 2021-04-28 DIAGNOSIS — J189 Pneumonia, unspecified organism: Secondary | ICD-10-CM | POA: Diagnosis not present

## 2021-04-28 DIAGNOSIS — E440 Moderate protein-calorie malnutrition: Secondary | ICD-10-CM | POA: Diagnosis not present

## 2021-04-28 DIAGNOSIS — R7881 Bacteremia: Secondary | ICD-10-CM | POA: Diagnosis not present

## 2021-04-28 LAB — CULTURE, BLOOD (ROUTINE X 2)
Culture: NO GROWTH
Special Requests: ADEQUATE

## 2021-04-28 LAB — CBC
HCT: 32.1 % — ABNORMAL LOW (ref 36.0–46.0)
Hemoglobin: 10.3 g/dL — ABNORMAL LOW (ref 12.0–15.0)
MCH: 27.8 pg (ref 26.0–34.0)
MCHC: 32.1 g/dL (ref 30.0–36.0)
MCV: 86.8 fL (ref 80.0–100.0)
Platelets: 269 10*3/uL (ref 150–400)
RBC: 3.7 MIL/uL — ABNORMAL LOW (ref 3.87–5.11)
RDW: 14.1 % (ref 11.5–15.5)
WBC: 10.7 10*3/uL — ABNORMAL HIGH (ref 4.0–10.5)
nRBC: 0 % (ref 0.0–0.2)

## 2021-04-28 LAB — BASIC METABOLIC PANEL
Anion gap: 5 (ref 5–15)
BUN: 19 mg/dL (ref 8–23)
CO2: 25 mmol/L (ref 22–32)
Calcium: 7.9 mg/dL — ABNORMAL LOW (ref 8.9–10.3)
Chloride: 106 mmol/L (ref 98–111)
Creatinine, Ser: 0.48 mg/dL (ref 0.44–1.00)
GFR, Estimated: >60 mL/min (ref >60)
Glucose, Bld: 109 mg/dL — ABNORMAL HIGH (ref 70–99)
Potassium: 4.4 mmol/L (ref 3.5–5.1)
Sodium: 136 mmol/L (ref 135–145)

## 2021-04-28 NOTE — Progress Notes (Signed)
[facility] PROGRESS NOTE    Progress Note  Brooke Harrington  [id number] DOB: 04-20-1941 DOA: 04/23/2021 PCP: Patient, No Pcp Per (Inactive)     Brief Narrative:   Brooke Harrington is an 80 y.o. female past medical history of essential hypertension brought into the ED after having symptoms of cough and congestion 5 days prior to admission came into the ED was found to be hypoxic and confused chest x-ray showed pneumonia influenza PCR was positive she was started on Tamiflu she also had H. influenzae bacteremia and was started empirically on antibiotics. Due to not improving of her symptoms chest x-ray was done that showed fluid overloaded she was started on IV Lasix 1 to 2 days.    Assessment/Plan:   Acute respiratory failure with hypoxia secondarily to pneumonia secondary to influenza A and H influenza and sepsis present on admission/H influenza bacteremia: Started on Tamiflu on admission IV Rocephin and doxycycline due to her prolonged symptomatology. Continuing IV Rocephin. She remains on oxygen supplementation 2 L. Has remained afebrile leukocytosis improved. Still appears toxic appearing she relates she is weak and tired  Unspecified tachyarrhythmia/episode of SVT nonsustained overnight: Twelve-lead EKG shows sinus tachycardia with PACs, she was placed back on her metoprolol. Echocardiogram showed normal systolic function. Try to keep her potassium greater than 4 magnesium greater than 2. Arrhythmia likely triggered due to illness. Go ahead and increase her metoprolol  Normocytic anemia: Mild drop in hemoglobin likely dilutional no signs of overt bleeding.  Elevated troponins: In the setting of demand ischemia for infectious process. Continue aspirin will need to follow-up with cardiology as an outpatient.  Acute metabolic encephalopathy: Likely due to infectious process, CT of the head showed no acute findings. MRI of the brain was a poor study but did not show  any acute findings.   Her mentation continues to improve.  Normocytic anemia: Likely due to poor nutritional status, nutritionist has been consulted.  Mild hyponatremia: Resolved.  Obesity: Noted      DVT prophylaxis: lovenox Family Communication:none Status is: Inpatient  Remains inpatient appropriate because: H influenza bacteremia      Code Status:     Code Status Orders  (From admission, onward)           Start     Ordered   04/24/21 0400  Full code  Continuous        04/24/21 0402           Code Status History     This patient has a current code status but no historical code status.         IV Access:   Peripheral IV   Procedures and diagnostic studies:   No results found.   Medical Consultants:   None.   Subjective:    Brooke Harrington feels tired and weak she relates she feels worse than yesterday.  Objective:    Vitals:   04/27/21 1950 04/28/21 0500 04/28/21 0820 04/28/21 0921  BP:  (!) 121/51 (!) 146/44   Pulse:  82 81   Resp:  17    Temp:  98.4 F (36.9 C)    TempSrc:  Oral    SpO2: 96% 97%  95%  Weight:      Height:       SpO2: 95 % O2 Flow Rate (L/min): 3 L/min   Intake/Output Summary (Last 24 hours) at 04/28/2021 1128 Last data filed at 04/28/2021 0500 Gross per 24 hour  Intake 220 ml  Output 300  ml  Net -80 ml    Filed Weights   04/24/21 0149  Weight: 75 kg    Exam: General exam: In no acute distress. Respiratory system: Good air movement and clear to auscultation. Cardiovascular system: S1 & S2 heard, RRR. No JVD. Gastrointestinal system: Abdomen is nondistended, soft and nontender.  Extremities: No pedal edema. Skin: No rashes, lesions or ulcers Psychiatry: Judgement and insight appear normal. Mood & affect appropriate.   Data Reviewed:    Labs: Basic Metabolic Panel: Recent Labs  Lab 04/23/21 2036 04/24/21 0559 04/25/21 0806 04/26/21 0332 04/27/21 0430 04/28/21 0140  NA  --   134* 136 137 135 136  K  --  4.5 3.7 4.5 4.2 4.4  CL  --  101 102 105 102 106  CO2  --  23 24 24 24 25   GLUCOSE  --  112* 101* 118* 109* 109*  BUN  --  35* 25* 21 15 19   CREATININE  --  0.75 0.56 0.62 0.56 0.48  CALCIUM  --  8.3* 8.0* 8.3* 7.8* 7.9*  MG 1.9 1.8 2.2  --   --   --     GFR Estimated Creatinine Clearance: 50.7 mL/min (by C-G formula based on SCr of 0.48 mg/dL). Liver Function Tests: Recent Labs  Lab 04/23/21 1852 04/24/21 0559  AST 39 27  ALT 30 26  ALKPHOS 73 71  BILITOT 0.8 1.0  PROT 6.6 5.7*  ALBUMIN 2.3* 1.8*    No results for input(s): LIPASE, AMYLASE in the last 168 hours. No results for input(s): AMMONIA in the last 168 hours. Coagulation profile No results for input(s): INR, PROTIME in the last 168 hours. COVID-19 Labs  Recent Labs    04/26/21 0332  FERRITIN 258     Lab Results  Component Value Date   [place] NEGATIVE 04/23/2021    CBC: Recent Labs  Lab 04/23/21 1852 04/24/21 0559 04/25/21 0806 04/26/21 0332 04/27/21 0430 04/28/21 0140  WBC 12.0* 16.9* 12.3* 13.3* 10.7* 10.7*  NEUTROABS 10.6* 14.9*  --   --   --   --   HGB 12.6 12.0 10.7* 10.6* 10.2* 10.3*  HCT 38.5 36.3 33.3* 32.0* 31.7* 32.1*  MCV 85.2 86.4 86.0 85.6 86.8 86.8  PLT 278 273 220 244 250 269    Cardiac Enzymes: No results for input(s): CKTOTAL, CKMB, CKMBINDEX, TROPONINI in the last 168 hours. BNP (last 3 results) No results for input(s): PROBNP in the last 8760 hours. CBG: No results for input(s): GLUCAP in the last 168 hours. D-Dimer: No results for input(s): DDIMER in the last 72 hours. Hgb A1c: No results for input(s): HGBA1C in the last 72 hours. Lipid Profile: No results for input(s): CHOL, HDL, LDLCALC, TRIG, CHOLHDL, LDLDIRECT in the last 72 hours. Thyroid function studies: No results for input(s): TSH, T4TOTAL, T3FREE, THYROIDAB in the last 72 hours.  Invalid input(s): FREET3 Anemia work up: Recent Labs    04/26/21 0332  VITAMINB12  2,656*  FOLATE 8.9  FERRITIN 258  TIBC 185*  IRON 21*  RETICCTPCT 0.6    Sepsis Labs: Recent Labs  Lab 04/23/21 1852 04/23/21 2036 04/24/21 0559 04/24/21 0824 04/25/21 0806 04/26/21 0332 04/27/21 0430 04/28/21 0140  WBC 12.0*  --  16.9*  --  12.3* 13.3* 10.7* 10.7*  LATICACIDVEN 1.9 1.7 1.4 1.2  --   --   --   --     Microbiology Recent Results (from the past 240 hour(s))  Blood culture (routine x 2)  Status: None   Collection Time: 04/23/21  6:36 PM   Specimen: BLOOD  Result Value Ref Range Status   Specimen Description   Final    BLOOD LEFT ANTECUBITAL Performed at [facility], [address] [place], [place] [address]    Special Requests   Final    BOTTLES DRAWN AEROBIC AND ANAEROBIC Blood Culture adequate volume Performed at [facility], [address] [place], [place] [address]    Culture   Final    NO GROWTH 5 DAYS Performed at [facility], [address], [place], [place] [address]    Report Status 04/28/2021 FINAL  Final  Blood culture (routine x 2)     Status: Abnormal (Preliminary result)   Collection Time: 04/23/21  6:49 PM   Specimen: BLOOD  Result Value Ref Range Status   Specimen Description   Final    BLOOD RIGHT ANTECUBITAL Performed at [facility], [address] [place], [place] [address]    Special Requests   Final    BOTTLES DRAWN AEROBIC AND ANAEROBIC Blood Culture adequate volume Performed at [facility], [address] [place], [place] [address]    Culture  Setup Time   Final    AEROBIC BOTTLE ONLY GRAM NEGATIVE COCCOBACILLI CRITICAL RESULT CALLED TO, READ BACK BY AND VERIFIED WITH: V BRYK PHARMD 04/25/21 0507 JDW    Culture (A)  Final    HAEMOPHILUS INFLUENZAE BETA LACTAMASE NEGATIVE HEALTH DEPARTMENT NOTIFIED Referred to [facility] in [place], [place] for serotyping. Performed at [facility], [address], [place], [place] [address]    Report Status  PENDING  Incomplete  Blood Culture ID Panel (Reflexed)     Status: Abnormal   Collection Time: 04/23/21  6:49 PM  Result Value Ref Range Status   Enterococcus faecalis NOT DETECTED NOT DETECTED Final   Enterococcus Faecium NOT DETECTED NOT DETECTED Final   Listeria monocytogenes NOT DETECTED NOT DETECTED Final   Staphylococcus species NOT DETECTED NOT DETECTED Final   Staphylococcus aureus (BCID) NOT DETECTED NOT DETECTED Final   Staphylococcus epidermidis NOT DETECTED NOT DETECTED Final   Staphylococcus lugdunensis NOT DETECTED NOT DETECTED Final   Streptococcus species NOT DETECTED NOT DETECTED Final   Streptococcus agalactiae NOT DETECTED NOT DETECTED Final   Streptococcus pneumoniae NOT DETECTED NOT DETECTED Final   Streptococcus pyogenes NOT DETECTED NOT DETECTED Final   A.calcoaceticus-baumannii NOT DETECTED NOT DETECTED Final   Bacteroides fragilis NOT DETECTED NOT DETECTED Final   Enterobacterales NOT DETECTED NOT DETECTED Final   Enterobacter cloacae complex NOT DETECTED NOT DETECTED Final   Escherichia coli NOT DETECTED NOT DETECTED Final   Klebsiella aerogenes NOT DETECTED NOT DETECTED Final   Klebsiella oxytoca NOT DETECTED NOT DETECTED Final   Klebsiella pneumoniae NOT DETECTED NOT DETECTED Final   Proteus species NOT DETECTED NOT DETECTED Final   Salmonella species NOT DETECTED NOT DETECTED Final   Serratia marcescens NOT DETECTED NOT DETECTED Final   Haemophilus influenzae DETECTED (A) NOT DETECTED Final    Comment: CRITICAL RESULT CALLED TO, READ BACK BY AND VERIFIED WITH: V BRYK PHARMD 04/25/21 0507 JDW    Neisseria meningitidis NOT DETECTED NOT DETECTED Final   Pseudomonas aeruginosa NOT DETECTED NOT DETECTED Final   Stenotrophomonas maltophilia NOT DETECTED NOT DETECTED Final   Candida albicans NOT DETECTED NOT DETECTED Final   Candida auris NOT DETECTED NOT DETECTED Final   Candida glabrata NOT DETECTED  NOT DETECTED Final   Candida krusei NOT DETECTED NOT  DETECTED Final   Candida parapsilosis NOT DETECTED NOT DETECTED Final   Candida tropicalis NOT DETECTED NOT DETECTED Final   Cryptococcus neoformans/gattii NOT DETECTED NOT DETECTED Final    Comment: Performed at [facility], [address], [place], [place] [address]  Resp Panel by RT-PCR (Flu A&B, Covid) Nasopharyngeal Swab     Status: Abnormal   Collection Time: 04/23/21  6:52 PM   Specimen: Nasopharyngeal Swab; Nasopharyngeal(NP) swabs in vial transport medium  Result Value Ref Range Status   SARS Coronavirus 2 by RT PCR NEGATIVE NEGATIVE Final    Comment: (NOTE) SARS-CoV-2 target nucleic acids are NOT DETECTED.  The SARS-CoV-2 RNA is generally detectable in upper respiratory specimens during the acute phase of infection. The lowest concentration of SARS-CoV-2 viral copies this assay can detect is 138 copies/mL. A negative result does not preclude SARS-Cov-2 infection and should not be used as the sole basis for treatment or other patient management decisions. A negative result may occur with  improper specimen collection/handling, submission of specimen other than nasopharyngeal swab, presence of viral mutation(s) within the areas targeted by this assay, and inadequate number of viral copies(<138 copies/mL). A negative result must be combined with clinical observations, patient history, and epidemiological information. The expected result is Negative.  Fact Sheet for Patients:  BloggerCourse.com  Fact Sheet for Healthcare Providers:  SeriousBroker.it  This test is no t yet approved or cleared by the [place] FDA and  has been authorized for detection and/or diagnosis of SARS-CoV-2 by FDA under an Emergency Use Authorization (EUA). This EUA will remain  in effect (meaning this test can be used) for the duration of the COVID-19 declaration under Section 564(b)(1) of the Act, 21 U.S.C.section 360bbb-3(b)(1), unless  the authorization is terminated  or revoked sooner.       Influenza A by PCR POSITIVE (A) NEGATIVE Final   Influenza B by PCR NEGATIVE NEGATIVE Final    Comment: (NOTE) The Xpert Xpress SARS-CoV-2/FLU/RSV plus assay is intended as an aid in the diagnosis of influenza from Nasopharyngeal swab specimens and should not be used as a sole basis for treatment. Nasal washings and aspirates are unacceptable for Xpert Xpress SARS-CoV-2/FLU/RSV testing.  Fact Sheet for Patients: BloggerCourse.com  Fact Sheet for Healthcare Providers: SeriousBroker.it  This test is not yet approved or cleared by the [place] FDA and has been authorized for detection and/or diagnosis of SARS-CoV-2 by FDA under an Emergency Use Authorization (EUA). This EUA will remain in effect (meaning this test can be used) for the duration of the COVID-19 declaration under Section 564(b)(1) of the Act, 21 U.S.C. section 360bbb-3(b)(1), unless the authorization is terminated or revoked.  Performed at [facility], [address] [place], [place] [address]      Medications:    aspirin EC  81 mg Oral Daily   enoxaparin (LOVENOX) injection  40 mg Subcutaneous Q24H   feeding supplement  237 mL Oral BID BM   guaiFENesin  600 mg Oral BID   levalbuterol  0.63 mg Nebulization BID   metoprolol tartrate  50 mg Oral BID   multivitamin with minerals  1 tablet Oral Daily   polyethylene glycol  17 g Oral Daily   senna-docusate  1 tablet Oral QHS   Continuous Infusions:  cefTRIAXone (ROCEPHIN)  IV Stopped (04/27/21 1454)      LOS: 4 days   Marinda Elk  [facility]  04/28/2021, 11:28 AM

## 2021-04-28 NOTE — Progress Notes (Signed)
Physical Therapy Treatment Patient Details Name: Brooke Harrington MRN: [id number] DOB: 02-28-1941 Today's Date: 04/28/2021   History of Present Illness Pt is an 80 y/o female admitted 12/24 secondary to AMS and weakness. Found to have PNA and the flu. PMH includes HTN.    PT Comments    The pt was agreeable to participate in session and attempt mobility progression, but significant time had to be spent cleaning up loose BM in the bed prior to initiation of movement. The pt demos good ability to roll with minG at this time, but needs minA to complete transition to sitting EOB and for all OOB mobility. The pt was limited to short bouts of standing (~1 min at a time) and a few small pivoting steps, but remains motivated to progress OOB mobility and activity tolerance. Noted plan for CIR admission when medically stable, will continue to benefit from skilled PT acutely to progress muscular endurance for gait and further progress independence for transfers.     Recommendations for follow up therapy are one component of a multi-disciplinary discharge planning process, led by the attending physician.  Recommendations may be updated based on patient status, additional functional criteria and insurance authorization.  Follow Up Recommendations  Acute inpatient rehab (3hours/day)     Assistance Recommended at Discharge Frequent or constant Supervision/Assistance  Equipment Recommendations  Rolling walker (2 wheels);BSC/3in1    Recommendations for Other Services       Precautions / Restrictions Precautions Precautions: Fall Precaution Comments: monitor O2 (does not wear at baseline) Restrictions Weight Bearing Restrictions: No     Mobility  Bed Mobility Overal bed mobility: Needs Assistance Bed Mobility: Rolling;Sidelying to Sit Rolling: Min guard Sidelying to sit: Min assist       General bed mobility comments: minG with cues for hand placement to roll, minA to complete trunk  elevation to sitting EOB    Transfers Overall transfer level: Needs assistance Equipment used: Rolling walker (2 wheels) Transfers: Sit to/from Stand;Bed to chair/wheelchair/BSC Sit to Stand: Min assist Stand pivot transfers: Min assist   Step pivot transfers: Min assist     General transfer comment: minA to power up to standing, minA with increased time and cues for direction/turns with pivoting steps to [facility] and then to recliner    Ambulation/Gait               General Gait Details: small pivoting steps only at this time, limited by BM      Balance Overall balance assessment: Needs assistance Sitting-balance support: No upper extremity supported;Feet supported Sitting balance-Leahy Scale: Fair     Standing balance support: Single extremity supported Standing balance-Leahy Scale: Poor Standing balance comment: Reliant on UE and external support                            Cognition Arousal/Alertness: Awake/alert Behavior During Therapy: Flat affect Overall Cognitive Status: No family/caregiver present to determine baseline cognitive functioning Area of Impairment: Attention;Memory;Following commands;Safety/judgement;Awareness;Problem solving                   Current Attention Level: Sustained Memory: Decreased short-term memory Following Commands: Follows one step commands with increased time Safety/Judgement: Decreased awareness of safety;Decreased awareness of deficits Awareness: Intellectual Problem Solving: Slow processing;Decreased initiation;Difficulty sequencing;Requires tactile cues;Requires verbal cues General Comments: pt pleasant but with flat affect, able to follow cues with increased time. Aware that she had had a BM in the bed, but had not  called for assistance           General Comments General comments (skin integrity, edema, etc.): VSS on 3L O2. pt with loose BM needing +2 for clean up        PT Goals (current goals  can now be found in the care plan section) Acute Rehab PT Goals Patient Stated Goal: to go home PT Goal Formulation: With patient Time For Goal Achievement: 05/09/21 Potential to Achieve Goals: Good Progress towards PT goals: Progressing toward goals    Frequency    Min 3X/week      PT Plan Discharge plan needs to be updated       AM-PAC PT "6 Clicks" Mobility   Outcome Measure  Help needed turning from your back to your side while in a flat bed without using bedrails?: A Little Help needed moving from lying on your back to sitting on the side of a flat bed without using bedrails?: A Little Help needed moving to and from a bed to a chair (including a wheelchair)?: A Little Help needed standing up from a chair using your arms (e.g., wheelchair or bedside chair)?: A Lot Help needed to walk in hospital room?: Total Help needed climbing 3-5 steps with a railing? : Total 6 Click Score: 13    End of Session Equipment Utilized During Treatment: Gait belt Activity Tolerance: Patient tolerated treatment well Patient left: in chair;with call bell/phone within reach;with chair alarm set Nurse Communication: Mobility status PT Visit Diagnosis: Unsteadiness on feet (R26.81);Muscle weakness (generalized) (M62.81);Difficulty in walking, not elsewhere classified (R26.2)     Time: 7322-0254 PT Time Calculation (min) (ACUTE ONLY): 42 min  Charges:  $Therapeutic Activity: 38-52 mins                     Vickki Muff, PT, DPT   Acute Rehabilitation Department Pager #: [phone]   Ronnie Derby 04/28/2021, 3:15 PM

## 2021-04-28 NOTE — Progress Notes (Signed)
IP rehab admissions - Patient not medically ready today per attending MD.  Will continue to follow for medical readiness.  Call for questions.  [phone]

## 2021-04-29 ENCOUNTER — Other Ambulatory Visit: Payer: Self-pay

## 2021-04-29 ENCOUNTER — Inpatient Hospital Stay (HOSPITAL_COMMUNITY)
Admission: RE | Admit: 2021-04-29 | Discharge: 2021-05-10 | DRG: 945 | Disposition: A | Discharge status: Home Health | Payer: Medicare Other | Source: Intra-hospital | Attending: Physical Medicine and Rehabilitation | Admitting: Physical Medicine and Rehabilitation

## 2021-04-29 ENCOUNTER — Encounter (HOSPITAL_COMMUNITY): Payer: Self-pay | Admitting: Physical Medicine and Rehabilitation

## 2021-04-29 DIAGNOSIS — L89312 Pressure ulcer of right buttock, stage 2: Secondary | ICD-10-CM | POA: Diagnosis present

## 2021-04-29 DIAGNOSIS — E785 Hyperlipidemia, unspecified: Secondary | ICD-10-CM | POA: Diagnosis present

## 2021-04-29 DIAGNOSIS — J129 Viral pneumonia, unspecified: Secondary | ICD-10-CM | POA: Diagnosis not present

## 2021-04-29 DIAGNOSIS — K5901 Slow transit constipation: Secondary | ICD-10-CM | POA: Diagnosis not present

## 2021-04-29 DIAGNOSIS — Z6832 Body mass index (BMI) 32.0-32.9, adult: Secondary | ICD-10-CM

## 2021-04-29 DIAGNOSIS — I471 Supraventricular tachycardia: Secondary | ICD-10-CM | POA: Diagnosis present

## 2021-04-29 DIAGNOSIS — R5381 Other malaise: Principal | ICD-10-CM | POA: Diagnosis present

## 2021-04-29 DIAGNOSIS — R4189 Other symptoms and signs involving cognitive functions and awareness: Secondary | ICD-10-CM | POA: Diagnosis present

## 2021-04-29 DIAGNOSIS — R269 Unspecified abnormalities of gait and mobility: Secondary | ICD-10-CM | POA: Diagnosis present

## 2021-04-29 DIAGNOSIS — I10 Essential (primary) hypertension: Secondary | ICD-10-CM | POA: Diagnosis present

## 2021-04-29 DIAGNOSIS — K649 Unspecified hemorrhoids: Secondary | ICD-10-CM | POA: Diagnosis present

## 2021-04-29 DIAGNOSIS — R197 Diarrhea, unspecified: Secondary | ICD-10-CM | POA: Diagnosis present

## 2021-04-29 DIAGNOSIS — K5900 Constipation, unspecified: Secondary | ICD-10-CM | POA: Diagnosis present

## 2021-04-29 DIAGNOSIS — J189 Pneumonia, unspecified organism: Secondary | ICD-10-CM | POA: Diagnosis present

## 2021-04-29 DIAGNOSIS — E669 Obesity, unspecified: Secondary | ICD-10-CM | POA: Diagnosis present

## 2021-04-29 DIAGNOSIS — I4729 Other ventricular tachycardia: Secondary | ICD-10-CM

## 2021-04-29 LAB — CBC
HCT: 34 % — ABNORMAL LOW (ref 36.0–46.0)
Hemoglobin: 10.5 g/dL — ABNORMAL LOW (ref 12.0–15.0)
MCH: 27.4 pg (ref 26.0–34.0)
MCHC: 30.9 g/dL (ref 30.0–36.0)
MCV: 88.8 fL (ref 80.0–100.0)
Platelets: 245 10*3/uL (ref 150–400)
RBC: 3.83 MIL/uL — ABNORMAL LOW (ref 3.87–5.11)
RDW: 14.2 % (ref 11.5–15.5)
WBC: 9.1 10*3/uL (ref 4.0–10.5)
nRBC: 0 % (ref 0.0–0.2)

## 2021-04-29 LAB — BASIC METABOLIC PANEL
Anion gap: 8 (ref 5–15)
BUN: 20 mg/dL (ref 8–23)
CO2: 21 mmol/L — ABNORMAL LOW (ref 22–32)
Calcium: 7.9 mg/dL — ABNORMAL LOW (ref 8.9–10.3)
Chloride: 106 mmol/L (ref 98–111)
Creatinine, Ser: 0.49 mg/dL (ref 0.44–1.00)
GFR, Estimated: >60 mL/min (ref >60)
Glucose, Bld: 97 mg/dL (ref 70–99)
Potassium: 5 mmol/L (ref 3.5–5.1)
Sodium: 135 mmol/L (ref 135–145)

## 2021-04-29 MED ORDER — GUAIFENESIN ER 600 MG PO TB12
600.0000 mg | ORAL_TABLET | Freq: Two times a day (BID) | ORAL | Status: DC
  Administered 2021-04-29 – 2021-05-09 (×20): 600 mg via ORAL
  Filled 2021-04-29 (×20): qty 1

## 2021-04-29 MED ORDER — POLYETHYLENE GLYCOL 3350 17 G PO PACK
17.0000 g | PACK | Freq: Every day | ORAL | Status: DC
  Filled 2021-04-29 (×2): qty 1

## 2021-04-29 MED ORDER — ENOXAPARIN SODIUM 40 MG/0.4ML IJ SOSY
40.0000 mg | PREFILLED_SYRINGE | INTRAMUSCULAR | Status: DC
  Administered 2021-04-30 – 2021-05-09 (×10): 40 mg via SUBCUTANEOUS
  Filled 2021-04-29 (×10): qty 0.4

## 2021-04-29 MED ORDER — AMOXICILLIN-POT CLAVULANATE 875-125 MG PO TABS
1.0000 | ORAL_TABLET | Freq: Two times a day (BID) | ORAL | Status: DC
  Administered 2021-04-29: 11:00:00 1 via ORAL
  Filled 2021-04-29: qty 1

## 2021-04-29 MED ORDER — ASPIRIN EC 81 MG PO TBEC
81.0000 mg | DELAYED_RELEASE_TABLET | Freq: Every day | ORAL | Status: DC
  Administered 2021-04-30 – 2021-05-10 (×11): 81 mg via ORAL
  Filled 2021-04-29 (×12): qty 1

## 2021-04-29 MED ORDER — METOPROLOL TARTRATE 50 MG PO TABS: 50.0000 mg | ORAL_TABLET | Freq: Two times a day (BID) | ORAL | Status: DC

## 2021-04-29 MED ORDER — SENNOSIDES-DOCUSATE SODIUM 8.6-50 MG PO TABS
1.0000 | ORAL_TABLET | Freq: Every day | ORAL | Status: DC
  Administered 2021-04-30 – 2021-05-05 (×5): 1 via ORAL
  Filled 2021-04-29 (×5): qty 1

## 2021-04-29 MED ORDER — OSELTAMIVIR PHOSPHATE 30 MG PO CAPS
30.0000 mg | ORAL_CAPSULE | Freq: Two times a day (BID) | ORAL | Status: DC
  Filled 2021-04-29: qty 1

## 2021-04-29 MED ORDER — ENOXAPARIN SODIUM 40 MG/0.4ML IJ SOSY: 40.0000 mg | PREFILLED_SYRINGE | INTRAMUSCULAR | Status: DC

## 2021-04-29 MED ORDER — ASPIRIN 81 MG PO TBEC
81.0000 mg | DELAYED_RELEASE_TABLET | Freq: Every day | ORAL | 11 refills | Status: AC
Start: 2021-04-29 — End: ?

## 2021-04-29 MED ORDER — IPRATROPIUM-ALBUTEROL 0.5-2.5 (3) MG/3ML IN SOLN: 3.0000 mL | RESPIRATORY_TRACT | Status: DC | PRN

## 2021-04-29 MED ORDER — AMOXICILLIN-POT CLAVULANATE 875-125 MG PO TABS
1.0000 | ORAL_TABLET | Freq: Two times a day (BID) | ORAL | Status: AC
  Administered 2021-04-29 – 2021-04-30 (×2): 1 via ORAL
  Filled 2021-04-29 (×2): qty 1

## 2021-04-29 MED ORDER — ADULT MULTIVITAMIN W/MINERALS CH
1.0000 | ORAL_TABLET | Freq: Every day | ORAL | Status: DC
  Administered 2021-04-30 – 2021-05-10 (×11): 1 via ORAL
  Filled 2021-04-29 (×12): qty 1

## 2021-04-29 MED ORDER — IPRATROPIUM-ALBUTEROL 0.5-2.5 (3) MG/3ML IN SOLN
3.0000 mL | RESPIRATORY_TRACT | Status: DC | PRN
Start: 2021-04-29 — End: 2021-05-10

## 2021-04-29 MED ORDER — ENSURE ENLIVE PO LIQD
237.0000 mL | Freq: Two times a day (BID) | ORAL | Status: DC
  Administered 2021-04-30 – 2021-05-03 (×5): 237 mL via ORAL

## 2021-04-29 MED ORDER — ALBUTEROL SULFATE (2.5 MG/3ML) 0.083% IN NEBU: 2.5000 mg | INHALATION_SOLUTION | RESPIRATORY_TRACT | Status: DC | PRN

## 2021-04-29 MED ORDER — AMOXICILLIN-POT CLAVULANATE 875-125 MG PO TABS: 1.0000 | ORAL_TABLET | Freq: Two times a day (BID) | ORAL | 0 refills | Status: DC

## 2021-04-29 MED ORDER — METOPROLOL TARTRATE 50 MG PO TABS
50.0000 mg | ORAL_TABLET | Freq: Two times a day (BID) | ORAL | Status: DC
  Administered 2021-04-29 – 2021-05-04 (×10): 50 mg via ORAL
  Filled 2021-04-29 (×10): qty 1

## 2021-04-29 NOTE — Progress Notes (Signed)
Occupational Therapy Treatment Patient Details Name: Brooke Harrington MRN: [id number] DOB: 04/19/1941 Today's Date: 04/29/2021   History of present illness Pt is an 80 y/o female admitted 12/24 secondary to AMS and weakness. Found to have PNA and the flu. PMH includes HTN.   OT comments  Pt progressing toward goals, requires step by step cuing for transfers and mobility. Pt min A for ADLs, min guard for bed mobility, and min A for transfers. Pt remained on 2L O2 via [place] throughout session, VSS. Will continue to follow acutely to maximize safety and independence with ADLs and functional mobility. Recommend AIR/CIR at d/c.   Recommendations for follow up therapy are one component of a multi-disciplinary discharge planning process, led by the attending physician.  Recommendations may be updated based on patient status, additional functional criteria and insurance authorization.    Follow Up Recommendations  Acute inpatient rehab (3hours/day)    Assistance Recommended at Discharge Frequent or constant Supervision/Assistance  Equipment Recommendations  BSC/3in1;Other (comment) (RW)    Recommendations for Other Services      Precautions / Restrictions Precautions Precautions: Fall Precaution Comments: monitor O2 (does not wear at baseline) Restrictions Weight Bearing Restrictions: No       Mobility Bed Mobility Overal bed mobility: Needs Assistance Bed Mobility: Rolling;Sidelying to Sit Rolling: Min guard Sidelying to sit: Min guard       General bed mobility comments: requires increased time and rest breaks to move from suping > sitting EOB, heavy cuing    Transfers Overall transfer level: Needs assistance Equipment used: Rolling walker (2 wheels) Transfers: Sit to/from Stand;Bed to chair/wheelchair/BSC Sit to Stand: Min assist Stand pivot transfers: Min assist Step pivot transfers: Min assist             Balance Overall balance assessment: Needs  assistance Sitting-balance support: No upper extremity supported;Feet supported Sitting balance-Leahy Scale: Fair     Standing balance support: Single extremity supported Standing balance-Leahy Scale: Poor Standing balance comment: Reliant on UE and external support                           ADL either performed or assessed with clinical judgement   ADL Overall ADL's : Needs assistance/impaired Eating/Feeding: Set up;Sitting   Grooming: Supervision/safety;Sitting Grooming Details (indicate cue type and reason): able to wash face sitting EOB             Lower Body Dressing: Minimal assistance;Sitting/lateral leans Lower Body Dressing Details (indicate cue type and reason): Donned socks sitting EOB Toilet Transfer: Minimal assistance;Rolling walker (2 wheels);BSC/3in1;Stand-pivot Toilet Transfer Details (indicate cue type and reason): cues needed to keep RW nearby Toileting- Architect and Hygiene: Moderate assistance;Sitting/lateral lean Toileting - Clothing Manipulation Details (indicate cue type and reason): mod A for pericare and clothing mgmt     Functional mobility during ADLs: Minimal assistance      Extremity/Trunk Assessment Upper Extremity Assessment Upper Extremity Assessment: Generalized weakness   Lower Extremity Assessment Lower Extremity Assessment: Defer to PT evaluation        Vision   Vision Assessment?: Vision impaired- to be further tested in functional context   Perception Perception Perception: Not tested   Praxis Praxis Praxis: Not tested    Cognition Arousal/Alertness: Awake/alert Behavior During Therapy: Flat affect Overall Cognitive Status: No family/caregiver present to determine baseline cognitive functioning Area of Impairment: Attention;Memory;Following commands;Safety/judgement;Awareness;Problem solving  Current Attention Level: Sustained Memory: Decreased short-term  memory Following Commands: Follows one step commands with increased time Safety/Judgement: Decreased awareness of safety;Decreased awareness of deficits Awareness: Intellectual Problem Solving: Slow processing;Decreased initiation;Difficulty sequencing;Requires tactile cues;Requires verbal cues General Comments: pt able to verbalize need to have BM          Exercises     Shoulder Instructions       General Comments VSS during session, HR in 90-100's during BM on BSC, on 2L O2 via [place]    Pertinent Vitals/ Pain       Pain Assessment: No/denies pain  Home Living                                          Prior Functioning/Environment              Frequency  Min 2X/week        Progress Toward Goals  OT Goals(current goals can now be found in the care plan section)     Acute Rehab OT Goals Patient Stated Goal: go to rehab OT Goal Formulation: With patient Time For Goal Achievement: 05/10/21 Potential to Achieve Goals: Good ADL Goals Pt Will Perform Grooming: with modified independence;standing Pt Will Perform Lower Body Bathing: with supervision;sitting/lateral leans;sit to/from stand Pt Will Transfer to Toilet: with supervision;ambulating Pt/caregiver will Perform Home Exercise Program: Increased strength;Both right and left upper extremity;With theraband;Independently;With written HEP provided Additional ADL Goal #1: Pt to increase standing activity tolerance > 5-7 min during ADLs/mobility  Plan Frequency remains appropriate;Discharge plan needs to be updated    Co-evaluation                 AM-PAC OT "6 Clicks" Daily Activity     Outcome Measure   Help from another person eating meals?: A Little Help from another person taking care of personal grooming?: A Little Help from another person toileting, which includes using toliet, bedpan, or urinal?: A Lot Help from another person bathing (including washing, rinsing, drying)?: A  Lot Help from another person to put on and taking off regular upper body clothing?: A Little Help from another person to put on and taking off regular lower body clothing?: A Lot 6 Click Score: 15    End of Session Equipment Utilized During Treatment: Rolling walker (2 wheels);Oxygen  OT Visit Diagnosis: Unsteadiness on feet (R26.81);Other abnormalities of gait and mobility (R26.89);Muscle weakness (generalized) (M62.81)   Activity Tolerance Patient tolerated treatment well   Patient Left in bed;with call bell/phone within reach;with bed alarm set   Nurse Communication Mobility status        Time: 4665-9935 OT Time Calculation (min): 39 min  Charges: OT General Charges $OT Visit: 1 Visit OT Treatments $Self Care/Home Management : 38-52 mins  Alfonzo Beers, OTD, OTR/L Acute Rehab [phone]) [phone]   Mayer Masker 04/29/2021, 9:35 AM

## 2021-04-29 NOTE — Evaluation (Signed)
Occupational Therapy Assessment and Plan  Patient Details  Name: Brooke Harrington MRN: [id number] Date of Birth: [birth year]  OT Diagnosis: abnormal posture, cognitive deficits, disturbance of vision, and muscle weakness (generalized) Rehab Potential: Rehab Potential (ACUTE ONLY): Good ELOS: 10-12 days   Today's Date: 04/30/2021 OT Individual Time: 2174-7159 OT Individual Time Calculation (min): 74 min     Hospital Problem: Principal Problem:   Debility   Past Medical History:  Past Medical History:  Diagnosis Date   Hypertension    Past Surgical History:  Past Surgical History:  Procedure Laterality Date   HAND SURGERY      Assessment & Plan Clinical Impression: Brooke Harrington is an 80 year old right-handed female with history of hypertension, hyperlipidemia.  Per chart review patient lives with her children.  1 level home level entry.  Modified independent prior to admission.  She can do simple meal preparations and ADLs.  Presented 04/23/2021 with increasing cough x2 days and mild slurring of speech.  CT/MRI showed no acute intracranial abnormality.  Chest x-ray concerning for pneumonia in left mid to lower lung field.  Admission chemistry sodium 132 potassium 3.0 chloride 94 glucose 125 BUN 54 creatinine 1.04, lactic acid 1.9, WBC 12,000 blood cultures haemophilus influenza detected, BNP 369, troponin 452-416.  Patient was placed on Tamiflu.  There was concern for secondary bacterial infection due to many days of symptoms placed on ceftriaxone as well as doxycycline changed to Augmentin 04/29/2021.  Echocardiogram ejection fraction of 70 to 75% no wall motion abnormalities grade 1 diastolic dysfunction.  Lovenox added for DVT prophylaxis.  Hospital course bouts of SVT with rate 539 systolic blood pressure in the 70s she did initially receive IV adenosine with successful cardioversion and currently remains on Lopressor.  Tolerating a regular diet.  Therapy evaluations completed due to  patient decreased functional mobility was admitted for a comprehensive rehab program.  Patient currently requires min-mod with basic self-care skills secondary to muscle weakness, decreased cardiorespiratoy endurance and decreased oxygen support, decreased visual acuity, decreased initiation, decreased attention, decreased awareness, decreased problem solving, decreased safety awareness, and decreased memory, and decreased standing balance.  Prior to hospitalization, patient could complete BADLs with independent .  Patient will benefit from skilled intervention to increase independence with basic self-care skills prior to discharge home with family.  Anticipate patient will require 24 hour supervision and follow up home health.  OT - End of Session Endurance Deficit: Yes Endurance Deficit Description: Pt needed frequent rest breaks during routine ADL activity OT Assessment Rehab Potential (ACUTE ONLY): Good OT Barriers to Discharge: Decreased caregiver support;Lack of/limited family support;Inaccessible home environment;Incontinence;Wound Care OT Barriers to Discharge Comments: ?walker accessible bathroom OT Patient demonstrates impairments in the following area(s): Balance;Skin Integrity;Vision;Cognition;Endurance;Safety OT Basic ADL's Functional Problem(s): Eating;Grooming;Bathing;Dressing;Toileting OT Advanced ADL's Functional Problem(s): Simple Meal Preparation OT Transfers Functional Problem(s): Toilet;Tub/Shower OT Additional Impairment(s): None OT Plan OT Intensity: Minimum of 1-2 x/day, 45 to 90 minutes OT Frequency: 5 out of 7 days OT Duration/Estimated Length of Stay: 10-12 days OT Treatment/Interventions: Balance/vestibular training;Disease mangement/prevention;Self Care/advanced ADL retraining;Therapeutic Exercise;UE/LE Strength taining/ROM;Skin care/wound managment;Pain management;DME/adaptive equipment instruction;Cognitive remediation/compensation;Community  reintegration;Patient/family education;UE/LE Coordination activities;Therapeutic Activities;Visual/perceptual remediation/compensation;Psychosocial support;Functional mobility training;Discharge planning OT Self Feeding Anticipated Outcome(s): Supervision OT Basic Self-Care Anticipated Outcome(s): Supervision OT Toileting Anticipated Outcome(s): Supervision OT Bathroom Transfers Anticipated Outcome(s): Supervision OT Recommendation Recommendations for Other Services: Other (comment) (n/a) Patient destination: Home Follow Up Recommendations: Home health OT Equipment Recommended: To be determined   OT Evaluation Precautions/Restrictions  Precautions Precautions: Fall  Precaution Comments: 2L O2 via [place], low activity tolerance, impaired vision due to cataracts Restrictions Weight Bearing Restrictions: No Pain Pain Assessment Pain Scale: 0-10 Pain Score: 0-No pain Home Living/Prior Functioning Home Living Family/patient expects to be discharged to:: Private residence Living Arrangements: Children Available Help at Discharge: Family, Available PRN/intermittently Type of Home: House Home Access: Level entry Home Layout: One level Bathroom Shower/Tub: Optometrist: Yes  Lives With: Family (son and daughter in Sports [place]) IADL History Homemaking Responsibilities: Yes (per pt, she was independently performing her ADL routine and also doing light meal prep (usually involving the microwave)) Education: HS grad, Therapist, occupational Occupation: Retired Type of Occupation: Tour manager Leisure and Hobbies: watching Jeopardy Prior Function Level of Independence: Independent with basic ADLs, Independent with homemaking with ambulation Driving: No (due to cataracts/visual impairments) Vocation: Retired Surveyor, mining Baseline Vision/History: 1 Wears glasses;4 Cataracts Ability to See in Adequate Light: 2 Moderately impaired Patient Visual Report: No  change from baseline Vision Assessment?: Vision impaired- to be further tested in functional context (pt needing cues and assist to locate needed ADL items on table, also needed assist for walker management during transfers to avoid obstacles) Additional Comments: cataracts, blurry vision Perception  Perception: Within Functional Limits Praxis Praxis: Intact Cognition Overall Cognitive Status: Difficult to assess Arousal/Alertness: Awake/alert Orientation Level: Person;Place;Situation Person: Oriented Place: Oriented Situation: Oriented Year: 2022 Month: December Day of Week: Correct Memory: Impaired Memory Impairment: Storage deficit;Other (comment) (did not recall any of the 5 words after approximately 2 minute delay) Immediate Memory Recall: Sock;Blue;Bed Memory Recall Sock: Without Cue Memory Recall Blue: With Cue Memory Recall Bed: With Cue Attention: Sustained Sustained Attention: Impaired Sustained Attention Impairment: Verbal complex;Functional basic Awareness: Impaired Awareness Impairment: Emergent impairment Problem Solving: Impaired Problem Solving Impairment: Verbal complex Executive Function: Initiating;Reasoning Reasoning: Impaired Initiating: Impaired Initiating Impairment: Functional basic;Verbal complex Behaviors: Other (comment) (appears somewhat apathetic) Safety/Judgment: Impaired Sensation Sensation Light Touch: Appears Intact Coordination Gross Motor Movements are Fluid and Coordinated: Yes Fine Motor Movements are Fluid and Coordinated: Yes Finger Nose Finger Test: Mild dysmetria bilaterally Heel Shin Test: WFL bilaterally, just slow d/t fatigue Motor  Motor Motor: Other (comment) Motor - Skilled Clinical Observations: Generalized weakness, slow to initiate, participation limited by fatigue  Trunk/Postural Assessment  Cervical Assessment Cervical Assessment: Exceptions to [facility] (forward head) Thoracic Assessment Thoracic Assessment: Exceptions  to [facility] (rounded shoulders) Lumbar Assessment Lumbar Assessment: Exceptions to [facility] (posterior pelvic tilt) Postural Control Postural Control: Within Functional Limits  Balance Balance Balance Assessed: Yes Static Sitting Balance Static Sitting - Balance Support: Bilateral upper extremity supported;Feet supported Static Sitting - Level of Assistance: 5: Stand by assistance Dynamic Sitting Balance Dynamic Sitting - Balance Support: During functional activity;No upper extremity supported Dynamic Sitting - Level of Assistance: 5: Stand by assistance (trying to thread LEs into pants) Dynamic Sitting Balance - Compensations: Dynamic standing: CGA during LB dressing Static Standing Balance Static Standing - Balance Support: Bilateral upper extremity supported;During functional activity Static Standing - Level of Assistance: 4: Min assist Extremity/Trunk Assessment RUE Assessment RUE Assessment: Within Functional Limits Active Range of Motion (AROM) Comments: WNL LUE Assessment LUE Assessment: Within Functional Limits Active Range of Motion (AROM) Comments: WNL  Care Tool Care Tool Self Care Eating    Min A    Oral Care    Oral Care Assist Level: Minimal Assistance - Patient > 75%    Bathing   Body parts bathed by patient: Right arm;Left arm;Chest;Abdomen;Front perineal area;Buttocks;Right upper leg;Left upper  leg;Right lower leg;Left lower leg;Face     Assist Level: Minimal Assistance - Patient > 75%    Upper Body Dressing(including orthotics)   What is the patient wearing?: Pull over shirt   Assist Level: Supervision/Verbal cueing    Lower Body Dressing (excluding footwear)   What is the patient wearing?: Pants;Incontinence brief Assist for lower body dressing: Moderate Assistance - Patient 50 - 74%    Putting on/Taking off footwear   What is the patient wearing?: Non-skid slipper socks;Ted hose Assist for footwear: Maximal Assistance - Patient 25 - 49%       Care Tool  Toileting Toileting activity   Assist for toileting: Maximal Assistance - Patient 25 - 49%     Care Tool Bed Mobility Roll left and right activity   Roll left and right assist level: Minimal Assistance - Patient > 75%             Toilet transfer   Assist Level: Contact Guard/Touching assist     Care Tool Cognition  Expression of Ideas and Wants Expression of Ideas and Wants: 3. Some difficulty - exhibits some difficulty with expressing needs and ideas (e.g, some words or finishing thoughts) or speech is not clear  Understanding Verbal and Non-Verbal Content Understanding Verbal and Non-Verbal Content: 3. Usually understands - understands most conversations, but misses some part/intent of message. Requires cues at times to understand   Memory/Recall Ability Memory/Recall Ability : Current season;That he or she is in a hospital/hospital unit   Refer to Care Plan for [place] 1 OT Short Term Goal 1 (Week 1): Pt will complete LB dressing with Min A using adaptive strategies OT Short Term Goal 2 (Week 1): Pt will standing at the sink for ~2 minutes while engaging in grooming tasks to increase standing endurance OT Short Term Goal 3 (Week 1): Pt will complete 1/3 components of toileting with no more than Min balance assistance while maintaining stable 02 sats  Recommendations for other services: None    Skilled Therapeutic Intervention Skilled OT session completed with focus on initial evaluation, education on OT role/POC, and establishment of patient-centered goals.   Pt greeted in bed with no c/o pain. Unsure if brief was clean, brief found to be saturated with ?urine of thickened texture. Pt reported burning during urination yesterday but no symptoms today. Informed nursing. Mod A for pericare bedlevel due to pt needing assistance with bottom due to skin breakdown. Pt did not remember urinating or how long she had been lying in saturated brief. BADLs were  then completed EOB at sit<stand level using RW. Pt required vcs for 02 mgt and basic problem solving. Pt had a difficult time with LB self care due to limited functional mobility of B LEs and needing cues for adaptive strategies to meet task demands at max level of independence. Pt declined needing to use the restroom but when in the middle of simulated transfer reported needing to void. CGA for ambulation using RW and for dynamic standing balance. She did need assist at times to navigate the walker due to impaired vision and bumping into doorway. Also had a tough time finding needing ADL items due to vision during ADL, needed increased time and cues. Pt with B+B void, assistance needed for posterior hygiene. Mod A to boost into standing from low toilet with vcs needed to use the grab bar. She then ambulated back to bed, assistance again for walker navigation around obstacles and 02  mgt during transfer. She returned to bed and was left with all needs within reach and bed alarm set.   02 sats on 2L throughout tx 94-95%, assessed post dynamic activity including stands and transfers today. Pt with some coughing though minimal instances of SOB   ADL ADL Eating: Minimal assistance (per most recent staff documentation) Grooming: Supervision/safety Where Assessed-Grooming: Edge of bed Upper Body Bathing: Supervision/safety Where Assessed-Upper Body Bathing: Edge of bed Lower Body Bathing: Minimal assistance Where Assessed-Lower Body Bathing: Edge of bed Upper Body Dressing: Supervision/safety Where Assessed-Upper Body Dressing: Edge of bed Lower Body Dressing: Moderate assistance Where Assessed-Lower Body Dressing: Edge of bed Toileting: Maximal assistance Where Assessed-Toileting: Glass blower/designer: Therapist, music Method: Ambulating (RW) Science writer: Energy manager: Not assessed Mobility  Bed Mobility Bed Mobility: Rolling Right;Right Sidelying to  Sit;Sit to Supine Rolling Right: Contact Guard/Touching assist Right Sidelying to Sit: Moderate Assistance - Patient 50-74% Sit to Supine: Minimal Assistance - Patient > 75% Transfers Sit to Stand: Minimal Assistance - Patient > 75% Stand to Sit: Contact Guard/Touching assist   Discharge Criteria: Patient will be discharged from OT if patient refuses treatment 3 consecutive times without medical reason, if treatment goals not met, if there is a change in medical status, if patient makes no progress towards goals or if patient is discharged from hospital.  The above assessment, treatment plan, treatment alternatives and goals were discussed and mutually agreed upon: by patient  Skeet Simmer 04/30/2021, 1:02 PM

## 2021-04-29 NOTE — Plan of Care (Signed)

## 2021-04-29 NOTE — H&P (Signed)
Physical Medicine and Rehabilitation Admission H&P        Chief Complaint  Patient presents with   Weakness  : HPI: Brooke Harrington is a 80 year old right-handed female with history of hypertension, hyperlipidemia.  Per chart review patient lives with her children.  1 level home level entry.  Modified independent prior to admission.  She can do simple meal preparations and ADLs.  Presented 04/23/2021 with increasing cough x2 days and mild slurring of speech.  CT/MRI showed no acute intracranial abnormality.  Chest x-ray concerning for pneumonia in left mid to lower lung field.  Admission chemistry sodium 132 potassium 3.0 chloride 94 glucose 125 BUN 54 creatinine 1.04, lactic acid 1.9, WBC 12,000 blood cultures haemophilus influenza detected, BNP 369, troponin 452-416.  Patient was placed on Tamiflu.  There was concern for secondary bacterial infection due to many days of symptoms placed on ceftriaxone as well as doxycycline changed to Augmentin 04/29/2021.  Echocardiogram ejection fraction of 70 to 75% no wall motion abnormalities grade 1 diastolic dysfunction.  Lovenox added for DVT prophylaxis.  Hospital course bouts of SVT with rate [id number] systolic blood pressure in the 70s she did initially receive IV adenosine with successful cardioversion and currently remains on Lopressor.  Tolerating a regular diet.  Therapy evaluations completed due to patient decreased functional mobility was admitted for a comprehensive rehab program.   Review of Systems  Constitutional:  Positive for malaise/fatigue. Negative for chills and fever.  HENT:  Negative for hearing loss.   Eyes:  Negative for blurred vision and double vision.  Respiratory:  Positive for cough and shortness of breath. Negative for wheezing.   Cardiovascular:  Positive for palpitations and leg swelling. Negative for chest pain.  Gastrointestinal:  Positive for constipation. Negative for nausea and vomiting.  Genitourinary:  Negative for  dysuria, flank pain and hematuria.  Musculoskeletal:  Positive for joint pain and myalgias.  Skin:  Negative for rash.  Neurological:  Positive for weakness.  All other systems reviewed and are negative.     Past Medical History:  Diagnosis Date   Hypertension           Past Surgical History:  Procedure Laterality Date   HAND SURGERY        History reviewed. No pertinent family history. Social History:  reports that she has never smoked. She has never used smokeless tobacco. She reports current alcohol use. She reports that she does not use drugs. Allergies: No Known Allergies       Medications Prior to Admission  Medication Sig Dispense Refill   acetaminophen (TYLENOL) 500 MG tablet Take 500 mg by mouth every 6 (six) hours as needed for mild pain, fever or headache.       ibuprofen (ADVIL) 200 MG tablet Take 600 mg by mouth every 6 (six) hours as needed for fever, headache or mild pain.          Drug Regimen Review Drug regimen was reviewed and remains appropriate with no significant issues identified.   Home: Home Living Family/patient expects to be discharged to:: Private residence Living Arrangements: Children Available Help at Discharge: Family, Available PRN/intermittently Type of Home: House Home Access: Level entry [place]: One level Bathroom Shower/Tub: Chiropodist: Standard Home Equipment: None   Functional History: Prior Function Prior Level of Function : Independent/Modified Independent ADLs Comments: soaks in tub, does laundry, can do simple meals but doesnt cook much. does not drive due to cataracts   Functional  Status:  Mobility: Bed Mobility Overal bed mobility: Needs Assistance Bed Mobility: Supine to Sit Supine to sit: Min assist, HOB elevated General bed mobility comments: assist to lift trunk, cues to use bedrail Transfers Overall transfer level: Needs assistance Equipment used: Rolling walker (2 wheels) Transfers: Sit  to/from Stand Sit to Stand: Min assist Bed to/from chair/wheelchair/BSC transfer type:: Stand pivot Stand pivot transfers: Mod assist General transfer comment: Mod A for lift assist and steadying to stand and pivot to chair. Unsteady throughout and pt reports feeling "unsure".   ADL: ADL Overall ADL's : Needs assistance/impaired Eating/Feeding: Set up, Sitting Eating/Feeding Details (indicate cue type and reason): to prepare coffee Grooming: Supervision/safety, Sitting Upper Body Bathing: Minimal assistance, Sitting Lower Body Bathing: Maximal assistance, Sit to/from stand Lower Body Bathing Details (indicate cue type and reason): to bathe peri region in standing after BM, limited standing endurance Upper Body Dressing : Minimal assistance, Sitting Lower Body Dressing: Maximal assistance, Sit to/from stand Toilet Transfer: Minimal assistance, Ambulation, Rolling walker (2 wheels) Toileting- Clothing Manipulation and Hygiene: Maximal assistance, Sit to/from stand General ADL Comments: New needs of supplemental O2 with activity, decreased standing endurance. pt endorses feeling weak   Cognition: Cognition Overall Cognitive Status: No family/caregiver present to determine baseline cognitive functioning Orientation Level: Oriented X4 Cognition Arousal/Alertness: Awake/alert Behavior During Therapy: Flat affect Overall Cognitive Status: No family/caregiver present to determine baseline cognitive functioning Area of Impairment: Attention, Memory, Following commands, Safety/judgement, Awareness, Problem solving Current Attention Level: Sustained Memory: Decreased short-term memory Following Commands: Follows one step commands with increased time Safety/Judgement: Decreased awareness of safety, Decreased awareness of deficits Awareness: Intellectual Problem Solving: Slow processing, Decreased initiation, Difficulty sequencing, Requires tactile cues, Requires verbal cues General Comments:  pleasant, flat affect, follows directions consistently but slower processing and noted decreased awareness. limited insight into conditions (MD present reporting damage to lungs but when OT asked what MD said after they stepped out, reports he "says Im doing fine". also reports continent but noted with BM in bed on entry   Physical Exam: Blood pressure (!) 132/53, pulse 84, temperature 98.4 F (36.9 C), temperature source Oral, resp. rate 18, height 5' (1.524 m), weight 75 kg, SpO2 94 %. Physical Exam Constitutional:      General: She is not in acute distress.    Appearance: She is ill-appearing.  HENT:     Right Ear: External ear normal.     Left Ear: External ear normal.     Mouth/Throat:     Mouth: Mucous membranes are moist.  Eyes:     Extraocular Movements: Extraocular movements intact.     Pupils: Pupils are equal, round, and reactive to light.  Cardiovascular:     Rate and Rhythm: Normal rate and regular rhythm.     Heart sounds: No murmur heard.   No gallop.  Pulmonary:     Effort: Pulmonary effort is normal. No respiratory distress.     Breath sounds: Rhonchi present. No wheezing.     Comments: O2 [place]  Abdominal:     General: There is no distension.     Palpations: Abdomen is soft.     Tenderness: There is no abdominal tenderness.  Musculoskeletal:     Right lower leg: Edema present.     Left lower leg: Edema present.  Skin:    General: Skin is warm.     Comments: Chronic changes in LE's, mild.   Neurological:     Comments: Patient is alert.  Sitting up in chair.  Oriented x3 and follows commands. Some delays in processing. Speech dysarthric/low volume. Able to provide biographical information. UE Motor 4/5. LE: 3/5 prox to 4/5 distally. No focal sensory abnl. No limb ataxia  Psychiatric:     Comments: Flat, slow to engage but cooperative.      Lab Results Last 48 Hours        Results for orders placed or performed during the hospital encounter of 04/23/21 (from the  past 48 hour(s))  CBC     Status: Abnormal    Collection Time: 04/25/21  8:06 AM  Result Value Ref Range    WBC 12.3 (H) 4.0 - 10.5 K/uL    RBC 3.87 3.87 - 5.11 MIL/uL    Hemoglobin 10.7 (L) 12.0 - 15.0 g/dL    HCT 50.3 (L) 54.6 - 46.0 %    MCV 86.0 80.0 - 100.0 fL    MCH 27.6 26.0 - 34.0 pg    MCHC 32.1 30.0 - 36.0 g/dL    RDW 56.8 12.7 - 51.7 %    Platelets 220 150 - 400 K/uL    nRBC 0.0 0.0 - 0.2 %      Comment: Performed at [facility], [address], [place], [place] [address]  Basic metabolic panel     Status: Abnormal    Collection Time: 04/25/21  8:06 AM  Result Value Ref Range    Sodium 136 135 - 145 mmol/L    Potassium 3.7 3.5 - 5.1 mmol/L    Chloride 102 98 - 111 mmol/L    CO2 24 22 - 32 mmol/L    Glucose, Bld 101 (H) 70 - 99 mg/dL      Comment: Glucose reference range applies only to samples taken after fasting for at least 8 hours.    BUN 25 (H) 8 - 23 mg/dL    Creatinine, Ser 9.44 0.44 - 1.00 mg/dL    Calcium 8.0 (L) 8.9 - 10.3 mg/dL    GFR, Estimated >96 >75 mL/min      Comment: (NOTE) Calculated using the CKD-EPI Creatinine Equation (2021)      Anion gap 10 5 - 15      Comment: Performed at [facility], [address], [place], [place] [address]  Magnesium     Status: None    Collection Time: 04/25/21  8:06 AM  Result Value Ref Range    Magnesium 2.2 1.7 - 2.4 mg/dL      Comment: Performed at [facility], [address] [place], [place] [address]  CBC     Status: Abnormal    Collection Time: 04/26/21  3:32 AM  Result Value Ref Range    WBC 13.3 (H) 4.0 - 10.5 K/uL    RBC 3.74 (L) 3.87 - 5.11 MIL/uL    Hemoglobin 10.6 (L) 12.0 - 15.0 g/dL    HCT 93.5 (L) 70.1 - 46.0 %    MCV 85.6 80.0 - 100.0 fL    MCH 28.3 26.0 - 34.0 pg    MCHC 33.1 30.0 - 36.0 g/dL    RDW 77.9 39.0 - 30.0 %    Platelets 244 150 - 400 K/uL    nRBC 0.0 0.0 - 0.2 %      Comment: Performed at [facility], [address] [place], [place] [address]  Basic  metabolic panel     Status: Abnormal    Collection Time: 04/26/21  3:32 AM  Result Value Ref Range  Sodium 137 135 - 145 mmol/L    Potassium 4.5 3.5 - 5.1 mmol/L      Comment: DELTA CHECK NOTED    Chloride 105 98 - 111 mmol/L    CO2 24 22 - 32 mmol/L    Glucose, Bld 118 (H) 70 - 99 mg/dL      Comment: Glucose reference range applies only to samples taken after fasting for at least 8 hours.    BUN 21 8 - 23 mg/dL    Creatinine, Ser 0.62 0.44 - 1.00 mg/dL    Calcium 8.3 (L) 8.9 - 10.3 mg/dL    GFR, Estimated >60 >60 mL/min      Comment: (NOTE) Calculated using the CKD-EPI Creatinine Equation (2021)      Anion gap 8 5 - 15      Comment: Performed at [facility] [address] [place], [place] [address]  Vitamin B12     Status: Abnormal    Collection Time: 04/26/21  3:32 AM  Result Value Ref Range    Vitamin B-12 2,656 (H) 180 - 914 pg/mL      Comment: (NOTE) This assay is not validated for testing neonatal or myeloproliferative syndrome specimens for Vitamin B12 levels. Performed at [facility], [place] [address] [place], [place] [address]    Folate     Status: None    Collection Time: 04/26/21  3:32 AM  Result Value Ref Range    Folate 8.9 >5.9 ng/mL      Comment: Performed at [facility] [address] [place], [place] [address]  Iron and TIBC     Status: Abnormal    Collection Time: 04/26/21  3:32 AM  Result Value Ref Range    Iron 21 (L) 28 - 170 ug/dL    TIBC 185 (L) 250 - 450 ug/dL    Saturation Ratios 11 10.4 - 31.8 %    UIBC 164 ug/dL      Comment: Performed at [facility], [place] [address], [place], [place] [address]  Ferritin     Status: None    Collection Time: 04/26/21  3:32 AM  Result Value Ref Range    Ferritin 258 11 - 307 ng/mL      Comment: Performed at [facility], [place] [address] [place], [place] [address]  Reticulocytes     Status: Abnormal    Collection Time: 04/26/21  3:32 AM  Result Value Ref Range    Retic Ct  Pct 0.6 0.4 - 3.1 %    RBC. 3.80 (L) 3.87 - 5.11 MIL/uL    Retic Count, Absolute 22.4 19.0 - 186.0 K/uL    Immature Retic Fract 18.8 (H) 2.3 - 15.9 %      Comment: Performed at [facility] [address], [place], [place] [address]  CBC     Status: Abnormal    Collection Time: 04/27/21  4:30 AM  Result Value Ref Range    WBC 10.7 (H) 4.0 - 10.5 K/uL    RBC 3.65 (L) 3.87 - 5.11 MIL/uL    Hemoglobin 10.2 (L) 12.0 - 15.0 g/dL    HCT 31.7 (L) 36.0 - 46.0 %    MCV 86.8 80.0 - 100.0 fL    MCH 27.9 26.0 - 34.0 pg    MCHC 32.2 30.0 - 36.0 g/dL    RDW 14.1 11.5 - 15.5 %    Platelets 250 150 - 400 K/uL  nRBC 0.0 0.0 - 0.2 %      Comment: Performed at [facility], [address] [place], [place] [address]  Basic metabolic panel     Status: Abnormal    Collection Time: 04/27/21  4:30 AM  Result Value Ref Range    Sodium 135 135 - 145 mmol/L    Potassium 4.2 3.5 - 5.1 mmol/L    Chloride 102 98 - 111 mmol/L    CO2 24 22 - 32 mmol/L    Glucose, Bld 109 (H) 70 - 99 mg/dL      Comment: Glucose reference range applies only to samples taken after fasting for at least 8 hours.    BUN 15 8 - 23 mg/dL    Creatinine, Ser 1.44 0.44 - 1.00 mg/dL    Calcium 7.8 (L) 8.9 - 10.3 mg/dL    GFR, Estimated >81 >85 mL/min      Comment: (NOTE) Calculated using the CKD-EPI Creatinine Equation (2021)      Anion gap 9 5 - 15      Comment: Performed at [facility], [address], [place], [place] [address]      Imaging Results (Last 48 hours)  No results found.           Medical Problem List and Plan: 1. Functional deficits. debility secondary to pneumonia/acute respiratory failure.  Pt also with resolving metabolic encephalopathy             -patient may shower             -ELOS/Goals: 7-10 days/ mod I for basic mobility and self-care 2.  Antithrombotics: -DVT/anticoagulation:  Pharmaceutical: Lovenox             -antiplatelet therapy: Aspirin 81 mg daily 3. Pain Management:  Tylenol as needed 4. Mood: Not emotional support             -antipsychotic agents: N/A 5. Neuropsych: This patient is capable of making decisions on her own behalf. 6. Skin/Wound Care: Routine skin checks 7. Fluids/Electrolytes/Nutrition: encourage PO  -pt is edentulous. I asked her if she would like chopped food and she said yes--->ordered D3 diet to start 8.  SVT.  Patient did initially require IV adenosine successful cardioversion.  Continue Lopressor 50 mg twice daily. Cardiology services follow-up             -monitor HR with activities 9.  Constipation.  MiraLAX 10. Pneumonia: complete course of tamiflu and augmentin             -wean from oxygen as able             -IS, OOB      Charlton Amor, PA-C 04/27/2021  I have personally performed a face to face diagnostic evaluation of this patient and formulated the key components of the plan.  Additionally, I have personally reviewed laboratory data, imaging studies, as well as relevant notes and concur with the physician assistant's documentation above.  The patient's status has not changed from the original H&P.  Any changes in documentation from the acute care chart have been noted above.  Ranelle Oyster, MD, [place]

## 2021-04-29 NOTE — Progress Notes (Signed)
Mobility Specialist Progress Note    04/29/21 1238  Mobility  Activity Ambulated in room  Level of Assistance Moderate assist, patient does 50-74%  Assistive Device Front wheel walker  Distance Ambulated (ft) 20 ft  Mobility Ambulated with assistance in room  Mobility Response Tolerated fair  Mobility performed by Mobility specialist  Bed Position Chair  $Mobility charge 1 Mobility   Pt received in bed with NT present and agreeable to get up. Pt stood for ~2 minutes for pericare. Needed ample cueing to pick up feet and help guiding and moving walker. Pt noticeably SOB when in chair. Left with call bell in reach and NT present.   [facility] Mobility Specialist  M.S. Primary Phone: [phone] M.S. Secondary Phone: [phone]

## 2021-04-29 NOTE — Progress Notes (Signed)
PMR Admission Coordinator Pre-Admission Assessment   Patient: Brooke Harrington is an 80 y.o., female MRN: [id number] DOB: 10/02/1940 Height: 5' (152.4 cm) Weight: 75 kg   Insurance Information HMO:     PPO:      PCP:      IPA:      80/20: yes     OTHER:  PRIMARY: Medicare A B      Policy#:   [id number]    Subscriber: Pt. Phone#: Verified online    Fax#:  Pre-Cert#:       Employer:  Benefits:  Phone #:      Name:  Eff. Date: Parts A ad B effective 05/01/2020  Deduct: $1556      Out of Pocket Max:  None      Life Max: N/A  CIR: 100%      SNF: 100 days Outpatient: 80%     Co-Pay: 20% Home Health: 100%      Co-Pay: none DME: 80%     Co-Pay: 20% Providers: patient's choice SECONDARY: none      Policy#:      Phone#:    Financial Counselor:       Phone#:    The [employer]uary for patients in Inpatient Rehabilitation Facilities with attached Privacy Act [place] Records was provided and verbally reviewed with: Patient   Emergency Contact Information Contact Information       Name Relation Home Work Mobile    [place] Daughter     [phone]    [place], Boys     [phone]           Current Medical History  Patient Admitting Diagnosis: Respiratory Failure History of Present Illness: Brooke Harrington is a 80 year old right-handed female with history of hypertension, hyperlipidemia.  Per chart review patient lives with her children.  1 level home level entry.  Modified independent prior to admission.  She can do simple meal preparations and ADLs.  Presented 04/23/2021 with increasing cough x2 days and mild slurring of speech.  CT/MRI showed no acute intracranial abnormality.  Chest x-ray concerning for pneumonia in left mid to lower lung field.  Admission chemistry sodium 132 potassium 3.0 chloride 94 glucose 125 BUN 54 creatinine 1.04, lactic acid 1.9, WBC 12,000 blood cultures haemophilus influenza detected, BNP 369, troponin 452-416.   Patient was placed on Tamiflu.  There was concern for secondary bacterial infection due to many days of symptoms placed on ceftriaxone as well as doxycycline.  Echocardiogram ejection fraction of 70 to 75% no wall motion abnormalities grade 1 diastolic dysfunction.  Lovenox added for DVT prophylaxis.  Hospital course bouts of SVT with rate 102 systolic blood pressure in the 70s she did initially receive IV adenosine with successful cardioversion and currently remains on Lopressor.  Tolerating a regular diet.  Therapy evaluations completed due to patient decreased functional mobility was admitted for a comprehensive rehab program.   Patient's medical record from [facility] has been reviewed by the rehabilitation admission coordinator and physician.   Past Medical History      Past Medical History:  Diagnosis Date   Hypertension        Has the patient had major surgery during 100 days prior to admission? No   Family History   family history is not on file.   Current Medications   Current Facility-Administered Medications:    aspirin EC tablet 81 mg, 81 mg, Oral, Daily, Rise Patience, MD, 81 mg at 04/26/21 [phone]  cefTRIAXone (ROCEPHIN) 2 g in sodium chloride 0.9 % 100 mL IVPB, 2 g, Intravenous, Q24H, Bonnielee Haff, MD, Last Rate: 200 mL/hr at 04/26/21 1427, 2 g at 04/26/21 1427   enoxaparin (LOVENOX) injection 40 mg, 40 mg, Subcutaneous, Q24H, Rise Patience, MD, 40 mg at 04/26/21 1427   feeding supplement (ENSURE ENLIVE / ENSURE PLUS) liquid 237 mL, 237 mL, Oral, BID BM, Bonnielee Haff, MD   guaiFENesin (MUCINEX) 12 hr tablet 600 mg, 600 mg, Oral, BID, Bonnielee Haff, MD, 600 mg at 04/26/21 2117   guaiFENesin (ROBITUSSIN) 100 MG/5ML liquid 5 mL, 5 mL, Oral, Q4H PRN, Bonnielee Haff, MD   ipratropium-albuterol (DUONEB) 0.5-2.5 (3) MG/3ML nebulizer solution 3 mL, 3 mL, Nebulization, Q4H PRN, Bonnielee Haff, MD   levalbuterol Penne Lash) nebulizer solution 0.63  mg, 0.63 mg, Nebulization, BID, Bonnielee Haff, MD, 0.63 mg at 04/27/21 2947   metoprolol tartrate (LOPRESSOR) tablet 25 mg, 25 mg, Oral, BID, Rise Patience, MD, 25 mg at 04/26/21 2117   multivitamin with minerals tablet 1 tablet, 1 tablet, Oral, Daily, Bonnielee Haff, MD, 1 tablet at 04/26/21 1427   oseltamivir (TAMIFLU) capsule 30 mg, 30 mg, Oral, BID, Rise Patience, MD, 30 mg at 04/26/21 2117   polyethylene glycol (MIRALAX / GLYCOLAX) packet 17 g, 17 g, Oral, Daily, Bonnielee Haff, MD, 17 g at 04/26/21 1033   senna-docusate (Senokot-S) tablet 1 tablet, 1 tablet, Oral, QHS, Bonnielee Haff, MD, 1 tablet at 04/26/21 2117   Patients Current Diet:  Diet Order                  Diet Heart Room service appropriate? Yes with Assist; Fluid consistency: Thin  Diet effective now                         Precautions / Restrictions Precautions Precautions: Fall Precaution Comments: monitor O2 (does not wear at baseline) Restrictions Weight Bearing Restrictions: No    Has the patient had 2 or more falls or a fall with injury in the past year? No   Prior Activity Level Limited Community (1-2x/wk): Pt. went out about 1x a week   Prior Functional Level Self Care: Did the patient need help bathing, dressing, using the toilet or eating? Independent   Indoor Mobility: Did the patient need assistance with walking from room to room (with or without device)? Independent   Stairs: Did the patient need assistance with internal or external stairs (with or without device)? Independent   Functional Cognition: Did the patient need help planning regular tasks such as shopping or remembering to take medications? Needed some help   Patient Information Are you of Hispanic, Latino/a,or Spanish origin?: A. No, not of Hispanic, Latino/a, or Spanish origin What is your race?: A. White Do you need or want an interpreter to communicate with a doctor or health care staff?: 0. No   Patient's  Response To:  Health Literacy and Transportation Is the patient able to respond to health literacy and transportation needs?: Yes Health Literacy - How often do you need to have someone help you when you read instructions, pamphlets, or other written material from your doctor or pharmacy?: Often In the past 12 months, has lack of transportation kept you from medical appointments or from getting medications?: No In the past 12 months, has lack of transportation kept you from meetings, work, or from getting things needed for daily living?: No   Development worker, international aid / [place] Devices/Equipment:  None Home Equipment: None   Prior Device Use: Indicate devices/aids used by the patient prior to current illness, exacerbation or injury? None of the above   Current Functional Level Cognition   Overall Cognitive Status: No family/caregiver present to determine baseline cognitive functioning Current Attention Level: Sustained Orientation Level: Oriented X4 Following Commands: Follows one step commands with increased time Safety/Judgement: Decreased awareness of safety, Decreased awareness of deficits General Comments: pleasant, flat affect, follows directions consistently but slower processing and noted decreased awareness. limited insight into conditions (MD present reporting damage to lungs but when OT asked what MD said after they stepped out, reports he "says Im doing fine". also reports continent but noted with BM in bed on entry    Extremity Assessment (includes Sensation/Coordination)   Upper Extremity Assessment: Generalized weakness  Lower Extremity Assessment: Defer to PT evaluation     ADLs   Overall ADL's : Needs assistance/impaired Eating/Feeding: Set up, Sitting Eating/Feeding Details (indicate cue type and reason): to prepare coffee Grooming: Supervision/safety, Sitting Upper Body Bathing: Minimal assistance, Sitting Lower Body Bathing: Maximal assistance, Sit  to/from stand Lower Body Bathing Details (indicate cue type and reason): to bathe peri region in standing after BM, limited standing endurance Upper Body Dressing : Minimal assistance, Sitting Lower Body Dressing: Maximal assistance, Sit to/from stand Toilet Transfer: Minimal assistance, Ambulation, Rolling walker (2 wheels) Toileting- Clothing Manipulation and Hygiene: Maximal assistance, Sit to/from stand General ADL Comments: New needs of supplemental O2 with activity, decreased standing endurance. pt endorses feeling weak     Mobility   Overal bed mobility: Needs Assistance Bed Mobility: Supine to Sit Supine to sit: Min assist, HOB elevated General bed mobility comments: assist to lift trunk, cues to use bedrail     Transfers   Overall transfer level: Needs assistance Equipment used: Rolling walker (2 wheels) Transfers: Sit to/from Stand Sit to Stand: Min assist Bed to/from chair/wheelchair/BSC transfer type:: Stand pivot Stand pivot transfers: Mod assist General transfer comment: Mod A for lift assist and steadying to stand and pivot to chair. Unsteady throughout and pt reports feeling "unsure".     Ambulation / Gait / Stairs / Proofreader / Balance Balance Overall balance assessment: Needs assistance Sitting-balance support: No upper extremity supported, Feet supported Sitting balance-Leahy Scale: Fair Standing balance support: Single extremity supported Standing balance-Leahy Scale: Poor Standing balance comment: Reliant on UE and external support     Special needs/care consideration Skin abrasion on upper back, ecchymosis on bilateral hands. Pt. On IV Rocephin    Previous Home Environment (from acute therapy documentation) Living Arrangements: Children Available Help at Discharge: Family, Available PRN/intermittently Type of Home: House Home Layout: One level Home Access: Level entry Bathroom Shower/Tub: Chiropodist:  [place]: No   Discharge Living Setting Plans for Discharge Living Setting: Patient's home Type of Home at Discharge: House Discharge Home Layout: One level Discharge Home Access: Level entry Discharge Bathroom Shower/Tub: Tub/shower unit Discharge Bathroom Toilet: Standard Discharge Bathroom Accessibility: No Does the patient have any problems obtaining your medications?: No   Social/Family/Support Systems Patient Roles: Other (Comment) Contact Information: [phone] Anticipated Caregiver: Branda Chaudhary (son) Anticipated Caregiver's Contact Information: [phone] Ability/Limitations of Caregiver: Can provide min A Caregiver Availability: Evenings only Discharge Plan Discussed with Primary Caregiver: Yes Is Caregiver In Agreement with Plan?: Yes Does Caregiver/Family have Issues with Lodging/Transportation while Pt is in Rehab?: No   Goals Patient/Family Goal for Rehab: PT/OT  Mod I Expected length of stay: 7-10 days Pt/Family Agrees to Admission and willing to participate: Yes Program Orientation Provided & Reviewed with Pt/Caregiver Including Roles  & Responsibilities: Yes   Decrease burden of Care through IP rehab admission: Specialzed equipment needs, Decrease number of caregivers, Bowel and bladder program, and Patient/family education   Possible need for SNF placement upon discharge: not anticipated    Patient Condition: I have reviewed medical records from [facility] , spoken with CM, and patient and family member. I met with patient at the bedside for inpatient rehabilitation assessment.  Patient will benefit from ongoing PT, OT, and SLP, can actively participate in 3 hours of therapy a day 5 days of the week, and can make measurable gains during the admission.  Patient will also benefit from the coordinated team approach during an Inpatient Acute Rehabilitation admission.  The patient will receive intensive therapy as well as  Rehabilitation physician, nursing, social worker, and care management interventions.  Due to safety, skin/wound care, disease management, medication administration, pain management, and patient education the patient requires 24 hour a day rehabilitation nursing.  The patient is currently min A with mobility and basic ADLs.  Discharge setting and therapy post discharge at home with home health is anticipated.  Patient has agreed to participate in the Acute Inpatient Rehabilitation Program and will admit today.   Preadmission Screen Completed By:  Genella Mech, 04/27/2021 8:36 AM ______________________________________________________________________   Discussed status with Dr. Naaman Plummer on 04/29/21 at 59 and received approval for admission today.   Admission Coordinator:  Genella Mech, CCC-SLP, time 1000/Date 04/29/21    Assessment/Plan: Diagnosis: debility after respiratory failure Does the need for close, 24 hr/day Medical supervision in concert with the patient's rehab needs make it unreasonable for this patient to be served in a less intensive setting? Yes Co-Morbidities requiring supervision/potential complications: HTN, pneumonia Due to bladder management, bowel management, safety, skin/wound care, disease management, medication administration, pain management, and patient education, does the patient require 24 hr/day rehab nursing? Yes Does the patient require coordinated care of a physician, rehab nurse, PT, OT to address physical and functional deficits in the context of the above medical diagnosis(es)? Yes Addressing deficits in the following areas: balance, endurance, locomotion, strength, transferring, bowel/bladder control, bathing, dressing, feeding, grooming, toileting, and psychosocial support Can the patient actively participate in an intensive therapy program of at least 3 hrs of therapy 5 days a week? Yes The potential for patient to make measurable gains while on inpatient rehab  is excellent Anticipated functional outcomes upon discharge from inpatient rehab: modified independent PT, modified independent OT, n/a SLP Estimated rehab length of stay to reach the above functional goals is: 7-10 days Anticipated discharge destination: Home 10. Overall Rehab/Functional Prognosis: excellent     MD Signature: Meredith Staggers, MD, [place] Director Rehabilitation Services

## 2021-04-29 NOTE — Discharge Summary (Signed)
Physician Discharge Summary  ELVI LUDWIG [id number] DOB: June 15, 1940 DOA: 04/23/2021  PCP: Patient, No Pcp Per (Inactive)  Admit date: 04/23/2021 Discharge date: 04/29/2021  Admitted From: Home Disposition: Inpatient rehab   Recommendations for Outpatient Follow-up:  Follow up with PCP in 1-2 weeks Please obtain BMP/CBC in one week   Home Health:No Equipment/Devices:none  Discharge Condition:Stable CODE STATUS:Full Diet recommendation: Heart Healthy  Brief/Interim Summary: 80 y.o. female past medical history of essential hypertension brought into the ED after having symptoms of cough and congestion 5 days prior to admission came into the ED was found to be hypoxic and confused chest x-ray showed pneumonia influenza PCR was positive she was started on Tamiflu she also had H. influenzae bacteremia and was started empirically on antibiotics. Due to not improving of her symptoms chest x-ray was done that showed fluid overloaded she was started on IV Lasix 1 to 2 days.  Discharge Diagnoses:  Principal Problem:   Influenza A Active Problems:   Nonsustained ventricular tachycardia   Pneumonia of left lower lobe due to infectious organism   CAP (community acquired pneumonia)   Malnutrition of moderate degree  Acute respiratory failure with hypoxia secondarily to pneumonia due to influenza A and H influenza/sepsis present on admission/H influenza bacteremia: She was started on Tamiflu and admission. She was also started on IV Rocephin and doxycycline, her blood cultures came back for H. influenzae back beta-lactam negative. She was originally in 15 L of oxygen on the day of discharge she remained on 2. She defervesced her leukocytosis improved she was changed to oral Augmentin which she will continue for 1 additional day.  Unspecified tachyarrhythmia/episode of SVT nonsustained overnight: Twelve-lead EKG shows sinus tachycardia, she was placed back on her metoprolol. 2D echo  showed normal systolic function. Her potassium was kept greater than 4 magnesium greater than 2. Her arrhythmia was likely triggered by her acute severity of illness. She will go home on metoprolol.  Normocytic anemia: Mild drop in hemoglobin likely dilutional no signs of overt bleeding.  Elevated troponins: In the setting of demand ischemia of infectious process. She will need to follow-up with cardiology as an outpatient.  Acute metabolic encephalopathy: Likely due to infectious process CT of the head showed no acute findings. MRI of the brain showed no acute CVA. Her mentation continued to improve.  Mild hypovolemic hyponatremia: Resolved with IV fluids.   Discharge Instructions  Discharge Instructions     Diet - low sodium heart healthy   Complete by: As directed    Discharge wound care:   Complete by: As directed    Per wound care instructions   Increase activity slowly   Complete by: As directed       Allergies as of 04/29/2021   No Known Allergies      Medication List     TAKE these medications    acetaminophen 500 MG tablet Commonly known as: TYLENOL Take 500 mg by mouth every 6 (six) hours as needed for mild pain, fever or headache.   amoxicillin-clavulanate 875-125 MG tablet Commonly known as: AUGMENTIN Take 1 tablet by mouth every 12 (twelve) hours for 1 day.   aspirin 81 MG EC tablet Take 1 tablet (81 mg total) by mouth daily. Swallow whole.   ibuprofen 200 MG tablet Commonly known as: ADVIL Take 600 mg by mouth every 6 (six) hours as needed for fever, headache or mild pain.   ipratropium-albuterol 0.5-2.5 (3) MG/3ML Soln Commonly known as: DUONEB Take 3 mLs by  nebulization every 4 (four) hours as needed.   metoprolol tartrate 50 MG tablet Commonly known as: LOPRESSOR Take 1 tablet (50 mg total) by mouth 2 (two) times daily.               Discharge Care Instructions  (From admission, onward)           Start     Ordered    04/29/21 0000  Discharge wound care:       Comments: Per wound care instructions   04/29/21 0929            No Known Allergies  Consultations: None   Procedures/Studies: CT Head Wo Contrast  Result Date: 04/23/2021 CLINICAL DATA:  Delirium EXAM: CT HEAD WITHOUT CONTRAST TECHNIQUE: Contiguous axial images were obtained from the base of the skull through the vertex without intravenous contrast. COMPARISON:  07/13/2007 FINDINGS: Brain: There is no mass, hemorrhage or extra-axial collection. There is generalized atrophy without lobar predilection. Hypodensity of the white matter is most commonly associated with chronic microvascular disease. Vascular: No abnormal hyperdensity of the major intracranial arteries or dural venous sinuses. No intracranial atherosclerosis. Skull: The visualized skull base, calvarium and extracranial soft tissues are normal. Sinuses/Orbits: Diffuse mild paranasal sinus mucosal thickening. The orbits are normal. IMPRESSION: Generalized atrophy and chronic microvascular ischemia without acute intracranial abnormality. Electronically Signed   By: Deatra Robinson M.D.   On: 04/23/2021 21:24   MR BRAIN WO CONTRAST  Result Date: 04/24/2021 CLINICAL DATA:  80 year old female with altered mental status. EXAM: MRI HEAD WITHOUT CONTRAST TECHNIQUE: Multiplanar, multiecho pulse sequences of the brain and surrounding structures were obtained without intravenous contrast. COMPARISON:  Head CT 04/23/2021. FINDINGS: The examination had to be discontinued prior to completion due to patient altered mental status. DWI, sagittal T1 and axial T2 imaging only, which is intermittently degraded by motion. Brain: No restricted diffusion to suggest acute infarction. No midline shift, mass effect, evidence of mass lesion, ventriculomegaly, extra-axial collection or acute intracranial hemorrhage. Cervicomedullary junction and pituitary are within normal limits. Vascular: Major intracranial vascular  flow voids are preserved, dominant left vertebral artery suspected. Skull and upper cervical spine: Grossly negative. Sinuses/Orbits: Negative orbits. Widespread paranasal sinus fluid and mucosal thickening redemonstrated. Other: Mastoids are stable and well aerated. Grossly normal visible internal auditory structures. IMPRESSION: 1. Truncated and motion degraded exam. No acute intracranial abnormality. 2. Bilateral paranasal sinus inflammation. Electronically Signed   By: Odessa Fleming M.D.   On: 04/24/2021 10:19   DG CHEST PORT 1 VIEW  Result Date: 04/24/2021 CLINICAL DATA:  Shortness of breath, weakness EXAM: PORTABLE CHEST 1 VIEW COMPARISON:  04/23/2021 FINDINGS: Heart is normal size. Diffuse interstitial prominence throughout the lungs, left greater than right. No effusions. No acute bony abnormality. IMPRESSION: Diffuse interstitial prominence throughout the lungs, left greater than right. This could reflect asymmetric edema or infection. Atypical pneumonia is possible. Electronically Signed   By: Charlett Nose M.D.   On: 04/24/2021 07:47   DG Chest Portable 1 View  Result Date: 04/23/2021 CLINICAL DATA:  Pneumonia. EXAM: PORTABLE CHEST 1 VIEW COMPARISON:  Chest radiograph dated 07/13/2007. FINDINGS: There is diffuse chronic intra coarsening and bronchitic changes. Diffuse interstitial nodularity and areas of airspace density in the left mid to lower lung field concerning for pneumonia. No pleural effusion pneumothorax. The cardiac silhouette is within normal limits. Atherosclerotic calcification of the aorta. No acute osseous pathology. IMPRESSION: Findings concerning for pneumonia in the left mid to lower lung field. Electronically Signed  By: Anner Crete M.D.   On: 04/23/2021 20:56   ECHOCARDIOGRAM COMPLETE  Result Date: 04/24/2021    ECHOCARDIOGRAM REPORT   Patient Name:   RAKIAH PROSCIA Date of Exam: 04/24/2021 Medical Rec #:  [id number]      Height:       60.0 in Accession #:    [id number]      Weight:       165.3 lb Date of Birth:  [birth year]     BSA:          1.722 m Patient Age:    27 years       BP:           111/66 mmHg Patient Gender: F              HR:           91 bpm. Exam Location:  Inpatient Procedure: 2D Echo, Cardiac Doppler and Color Doppler Indications:    Ventricular Tachycardia I47.2  History:        Patient has no prior history of Echocardiogram examinations.                 Risk Factors:Hypertension.  Sonographer:    Alvino Chapel RCS Referring Phys: [place]  1. Left ventricular ejection fraction, by estimation, is 70 to 75%. The left ventricle has hyperdynamic function. The left ventricle has no regional wall motion abnormalities. There is mild left ventricular hypertrophy. Left ventricular diastolic parameters are consistent with Grade I diastolic dysfunction (impaired relaxation).  2. Right ventricular systolic function is normal. The right ventricular size is normal. Tricuspid regurgitation signal is inadequate for assessing PA pressure.  3. Left atrial size was upper normal.  4. Thee is a trivial pericardial effusion anterior to the right ventricle.  5. The mitral valve is abnormal. Trivial mitral valve regurgitation.  6. The aortic valve has an indeterminant number of cusps. There is moderate calcification of the aortic valve. Aortic valve regurgitation is mild. Possible mild aortic valve stenosis. Aortic valve mean gradient measures 8.0 mmHg. Dimenionless index 0.62.  7. The inferior vena cava is normal in size with greater than 50% respiratory variability, suggesting right atrial pressure of 3 mmHg. Comparison(s): No prior Echocardiogram. FINDINGS  Left Ventricle: Left ventricular ejection fraction, by estimation, is 70 to 75%. The left ventricle has hyperdynamic function. The left ventricle has no regional wall motion abnormalities. The left ventricular internal cavity size was normal in size. There is mild left ventricular hypertrophy. Left  ventricular diastolic parameters are consistent with Grade I diastolic dysfunction (impaired relaxation). Right Ventricle: The right ventricular size is normal. No increase in right ventricular wall thickness. Right ventricular systolic function is normal. Tricuspid regurgitation signal is inadequate for assessing PA pressure. Left Atrium: Left atrial size was upper normal. Right Atrium: Right atrial size was normal in size. Pericardium: Trivial pericardial effusion is present. The pericardial effusion is anterior to the right ventricle. Presence of epicardial fat layer. Mitral Valve: The mitral valve is abnormal. Mild mitral annular calcification. Trivial mitral valve regurgitation. Tricuspid Valve: The tricuspid valve is grossly normal. Tricuspid valve regurgitation is trivial. Aortic Valve: The aortic valve has an indeterminant number of cusps. There is moderate calcification of the aortic valve. Aortic valve regurgitation is mild. Mild aortic stenosis is present. Aortic valve mean gradient measures 8.0 mmHg. Aortic valve peak  gradient measures 16.6 mmHg. Aortic valve area, by VTI measures 1.76 cm. Pulmonic Valve: The pulmonic valve was not  well visualized. Pulmonic valve regurgitation is trivial. Aorta: The aortic root is normal in size and structure. Venous: The inferior vena cava is normal in size with greater than 50% respiratory variability, suggesting right atrial pressure of 3 mmHg. IAS/Shunts: The interatrial septum appears to be lipomatous. No atrial level shunt detected by color flow Doppler.  LEFT VENTRICLE PLAX 2D LVIDd:         3.90 cm   Diastology LVIDs:         2.80 cm   LV e' medial:    5.44 cm/s LV PW:         1.10 cm   LV E/e' medial:  19.1 LV IVS:        1.20 cm   LV e' lateral:   4.68 cm/s LVOT diam:     1.90 cm   LV E/e' lateral: 22.2 LV SV:         71 LV SV Index:   41 LVOT Area:     2.84 cm  RIGHT VENTRICLE RV S prime:     12.80 cm/s TAPSE (M-mode): 2.0 cm LEFT ATRIUM             Index         RIGHT ATRIUM           Index LA diam:        4.10 cm 2.38 cm/m   RA Area:     13.40 cm LA Vol (A2C):   66.0 ml 38.34 ml/m  RA Volume:   30.90 ml  17.95 ml/m LA Vol (A4C):   43.3 ml 25.15 ml/m LA Biplane Vol: 53.9 ml 31.31 ml/m  AORTIC VALVE AV Area (Vmax):    1.85 cm AV Area (Vmean):   2.03 cm AV Area (VTI):     1.76 cm AV Vmax:           203.50 cm/s AV Vmean:          132.500 cm/s AV VTI:            0.402 m AV Peak Grad:      16.6 mmHg AV Mean Grad:      8.0 mmHg LVOT Vmax:         133.00 cm/s LVOT Vmean:        95.100 cm/s LVOT VTI:          0.250 m LVOT/AV VTI ratio: 0.62  AORTA Ao Root diam: 3.00 cm MITRAL VALVE MV Area (PHT): 2.20 cm     SHUNTS MV Decel Time: 345 msec     Systemic VTI:  0.25 m MV E velocity: 104.00 cm/s  Systemic Diam: 1.90 cm MV A velocity: 132.00 cm/s MV E/A ratio:  0.79 Rozann Lesches MD Electronically signed by Rozann Lesches MD Signature Date/Time: 04/24/2021/5:41:26 PM    Final    (Echo, Carotid, EGD, Colonoscopy, ERCP)    Subjective: No complaints  Discharge Exam: Vitals:   04/29/21 0402 04/29/21 0751  BP: (!) 92/45   Pulse: 84   Resp: 17   Temp: 98 F (36.7 C)   SpO2: 98% 98%   Vitals:   04/28/21 0921 04/28/21 2036 04/29/21 0402 04/29/21 0751  BP:  (!) 141/74 (!) 92/45   Pulse:  100 84   Resp:  20 17   Temp:  97.8 F (36.6 C) 98 F (36.7 C)   TempSrc:  Oral Oral   SpO2: 95% 98% 98% 98%  Weight:      Height:  General: Pt is alert, awake, not in acute distress Cardiovascular: RRR, S1/S2 +, no rubs, no gallops Respiratory: CTA bilaterally, no wheezing, no rhonchi Abdominal: Soft, NT, ND, bowel sounds + Extremities: no edema, no cyanosis    The results of significant diagnostics from this hospitalization (including imaging, microbiology, ancillary and laboratory) are listed below for reference.     Microbiology: Recent Results (from the past 240 hour(s))  Blood culture (routine x 2)     Status: None   Collection Time:  04/23/21  6:36 PM   Specimen: BLOOD  Result Value Ref Range Status   Specimen Description   Final    BLOOD LEFT ANTECUBITAL Performed at [facility], [address] [place], [place] [address]    Special Requests   Final    BOTTLES DRAWN AEROBIC AND ANAEROBIC Blood Culture adequate volume Performed at [facility], [address] [place], [place] [address]    Culture   Final    NO GROWTH 5 DAYS Performed at [facility], [place] [address] [place], [place] [address]    Report Status 04/28/2021 FINAL  Final  Blood culture (routine x 2)     Status: Abnormal (Preliminary result)   Collection Time: 04/23/21  6:49 PM   Specimen: BLOOD  Result Value Ref Range Status   Specimen Description   Final    BLOOD RIGHT ANTECUBITAL Performed at [facility], [address] [place], [place] [address]    Special Requests   Final    BOTTLES DRAWN AEROBIC AND ANAEROBIC Blood Culture adequate volume Performed at [facility], [address] [place], [place] [address]    Culture  Setup Time   Final    AEROBIC BOTTLE ONLY GRAM NEGATIVE COCCOBACILLI CRITICAL RESULT CALLED TO, READ BACK BY AND VERIFIED WITH: V BRYK PHARMD 04/25/21 0507 JDW    Culture (A)  Final    HAEMOPHILUS INFLUENZAE BETA LACTAMASE NEGATIVE HEALTH DEPARTMENT NOTIFIED Referred to [facility] in [place], [place] for serotyping. Performed at [facility], [place] [address] [place], [place] [address]    Report Status PENDING  Incomplete  Blood Culture ID Panel (Reflexed)     Status: Abnormal   Collection Time: 04/23/21  6:49 PM  Result Value Ref Range Status   Enterococcus faecalis NOT DETECTED NOT DETECTED Final   Enterococcus Faecium NOT DETECTED NOT DETECTED Final   Listeria monocytogenes NOT DETECTED NOT DETECTED Final   Staphylococcus species NOT DETECTED NOT DETECTED Final   Staphylococcus aureus (BCID) NOT DETECTED NOT DETECTED Final   Staphylococcus epidermidis  NOT DETECTED NOT DETECTED Final   Staphylococcus lugdunensis NOT DETECTED NOT DETECTED Final   Streptococcus species NOT DETECTED NOT DETECTED Final   Streptococcus agalactiae NOT DETECTED NOT DETECTED Final   Streptococcus pneumoniae NOT DETECTED NOT DETECTED Final   Streptococcus pyogenes NOT DETECTED NOT DETECTED Final   A.calcoaceticus-baumannii NOT DETECTED NOT DETECTED Final   Bacteroides fragilis NOT DETECTED NOT DETECTED Final   Enterobacterales NOT DETECTED NOT DETECTED Final   Enterobacter cloacae complex NOT DETECTED NOT DETECTED Final   Escherichia coli NOT DETECTED NOT DETECTED Final   Klebsiella aerogenes NOT DETECTED NOT DETECTED Final   Klebsiella oxytoca NOT DETECTED NOT DETECTED Final   Klebsiella pneumoniae NOT DETECTED NOT DETECTED Final   Proteus species NOT DETECTED NOT DETECTED Final   Salmonella species NOT DETECTED NOT DETECTED Final   Serratia marcescens NOT DETECTED NOT DETECTED Final  Haemophilus influenzae DETECTED (A) NOT DETECTED Final    Comment: CRITICAL RESULT CALLED TO, READ BACK BY AND VERIFIED WITH: V BRYK PHARMD 04/25/21 0507 JDW    Neisseria meningitidis NOT DETECTED NOT DETECTED Final   Pseudomonas aeruginosa NOT DETECTED NOT DETECTED Final   Stenotrophomonas maltophilia NOT DETECTED NOT DETECTED Final   Candida albicans NOT DETECTED NOT DETECTED Final   Candida auris NOT DETECTED NOT DETECTED Final   Candida glabrata NOT DETECTED NOT DETECTED Final   Candida krusei NOT DETECTED NOT DETECTED Final   Candida parapsilosis NOT DETECTED NOT DETECTED Final   Candida tropicalis NOT DETECTED NOT DETECTED Final   Cryptococcus neoformans/gattii NOT DETECTED NOT DETECTED Final    Comment: Performed at [facility], [address], [place], [place] [address]  Resp Panel by RT-PCR (Flu A&B, Covid) Nasopharyngeal Swab     Status: Abnormal   Collection Time: 04/23/21  6:52 PM   Specimen: Nasopharyngeal Swab; Nasopharyngeal(NP) swabs in vial transport  medium  Result Value Ref Range Status   SARS Coronavirus 2 by RT PCR NEGATIVE NEGATIVE Final    Comment: (NOTE) SARS-CoV-2 target nucleic acids are NOT DETECTED.  The SARS-CoV-2 RNA is generally detectable in upper respiratory specimens during the acute phase of infection. The lowest concentration of SARS-CoV-2 viral copies this assay can detect is 138 copies/mL. A negative result does not preclude SARS-Cov-2 infection and should not be used as the sole basis for treatment or other patient management decisions. A negative result may occur with  improper specimen collection/handling, submission of specimen other than nasopharyngeal swab, presence of viral mutation(s) within the areas targeted by this assay, and inadequate number of viral copies(<138 copies/mL). A negative result must be combined with clinical observations, patient history, and epidemiological information. The expected result is Negative.  Fact Sheet for Patients:  BloggerCourse.com  Fact Sheet for Healthcare Providers:  SeriousBroker.it  This test is no t yet approved or cleared by the [place] FDA and  has been authorized for detection and/or diagnosis of SARS-CoV-2 by FDA under an Emergency Use Authorization (EUA). This EUA will remain  in effect (meaning this test can be used) for the duration of the COVID-19 declaration under Section 564(b)(1) of the Act, 21 U.S.C.section 360bbb-3(b)(1), unless the authorization is terminated  or revoked sooner.       Influenza A by PCR POSITIVE (A) NEGATIVE Final   Influenza B by PCR NEGATIVE NEGATIVE Final    Comment: (NOTE) The Xpert Xpress SARS-CoV-2/FLU/RSV plus assay is intended as an aid in the diagnosis of influenza from Nasopharyngeal swab specimens and should not be used as a sole basis for treatment. Nasal washings and aspirates are unacceptable for Xpert Xpress SARS-CoV-2/FLU/RSV testing.  Fact Sheet for  Patients: BloggerCourse.com  Fact Sheet for Healthcare Providers: SeriousBroker.it  This test is not yet approved or cleared by the [place] FDA and has been authorized for detection and/or diagnosis of SARS-CoV-2 by FDA under an Emergency Use Authorization (EUA). This EUA will remain in effect (meaning this test can be used) for the duration of the COVID-19 declaration under Section 564(b)(1) of the Act, 21 U.S.C. section 360bbb-3(b)(1), unless the authorization is terminated or revoked.  Performed at [facility], [address] [place], [place] [address]      Labs: BNP (last 3 results) Recent Labs    04/23/21 1852  BNP 369.2*   Basic Metabolic Panel: Recent Labs  Lab 04/23/21 2036 04/24/21 0559 04/25/21 4982 04/26/21 6415  04/27/21 0430 04/28/21 0140 04/29/21 0234  NA  --  134* 136 137 135 136 135  K  --  4.5 3.7 4.5 4.2 4.4 5.0  CL  --  101 102 105 102 106 106  CO2  --  23 24 24 24 25  21*  GLUCOSE  --  112* 101* 118* 109* 109* 97  BUN  --  35* 25* 21 15 19 20   CREATININE  --  0.75 0.56 0.62 0.56 0.48 0.49  CALCIUM  --  8.3* 8.0* 8.3* 7.8* 7.9* 7.9*  MG 1.9 1.8 2.2  --   --   --   --    Liver Function Tests: Recent Labs  Lab 04/23/21 1852 04/24/21 0559  AST 39 27  ALT 30 26  ALKPHOS 73 71  BILITOT 0.8 1.0  PROT 6.6 5.7*  ALBUMIN 2.3* 1.8*   No results for input(s): LIPASE, AMYLASE in the last 168 hours. No results for input(s): AMMONIA in the last 168 hours. CBC: Recent Labs  Lab 04/23/21 1852 04/24/21 0559 04/25/21 0806 04/26/21 0332 04/27/21 0430 04/28/21 0140 04/29/21 0234  WBC 12.0* 16.9* 12.3* 13.3* 10.7* 10.7* 9.1  NEUTROABS 10.6* 14.9*  --   --   --   --   --   HGB 12.6 12.0 10.7* 10.6* 10.2* 10.3* 10.5*  HCT 38.5 36.3 33.3* 32.0* 31.7* 32.1* 34.0*  MCV 85.2 86.4 86.0 85.6 86.8 86.8 88.8  PLT 278 273 220 244 250 269 245   Cardiac Enzymes: No results for input(s):  CKTOTAL, CKMB, CKMBINDEX, TROPONINI in the last 168 hours. BNP: Invalid input(s): POCBNP CBG: No results for input(s): GLUCAP in the last 168 hours. D-Dimer No results for input(s): DDIMER in the last 72 hours. Hgb A1c No results for input(s): HGBA1C in the last 72 hours. Lipid Profile No results for input(s): CHOL, HDL, LDLCALC, TRIG, CHOLHDL, LDLDIRECT in the last 72 hours. Thyroid function studies No results for input(s): TSH, T4TOTAL, T3FREE, THYROIDAB in the last 72 hours.  Invalid input(s): FREET3 Anemia work up No results for input(s): VITAMINB12, FOLATE, FERRITIN, TIBC, IRON, RETICCTPCT in the last 72 hours. Urinalysis No results found for: COLORURINE, APPEARANCEUR, [place], [place], GLUCOSEU, [place], [place], [place], [place], UROBILINOGEN, NITRITE, LEUKOCYTESUR Sepsis Labs Invalid input(s): PROCALCITONIN,  WBC,  LACTICIDVEN Microbiology Recent Results (from the past 240 hour(s))  Blood culture (routine x 2)     Status: None   Collection Time: 04/23/21  6:36 PM   Specimen: BLOOD  Result Value Ref Range Status   Specimen Description   Final    BLOOD LEFT ANTECUBITAL Performed at [facility], [address] [place], [place] [address]    Special Requests   Final    BOTTLES DRAWN AEROBIC AND ANAEROBIC Blood Culture adequate volume Performed at [facility], [address] [place], [place] [address]    Culture   Final    NO GROWTH 5 DAYS Performed at [facility], [place] [address], [place], [place] [address]    Report Status 04/28/2021 FINAL  Final  Blood culture (routine x 2)     Status: Abnormal (Preliminary result)   Collection Time: 04/23/21  6:49 PM   Specimen: BLOOD  Result Value Ref Range Status   Specimen Description   Final    BLOOD RIGHT ANTECUBITAL Performed at [facility], [address] [place], [place] [address]    Special Requests   Final    BOTTLES DRAWN AEROBIC AND  ANAEROBIC Blood Culture adequate  volume Performed at [facility], [address] [place], [place] [address]    Culture  Setup Time   Final    AEROBIC BOTTLE ONLY GRAM NEGATIVE COCCOBACILLI CRITICAL RESULT CALLED TO, READ BACK BY AND VERIFIED WITH: V BRYK PHARMD 04/25/21 0507 JDW    Culture (A)  Final    HAEMOPHILUS INFLUENZAE BETA LACTAMASE NEGATIVE HEALTH DEPARTMENT NOTIFIED Referred to [facility] in [place], [place] for serotyping. Performed at [facility], [place] [address] [place], [place] [address]    Report Status PENDING  Incomplete  Blood Culture ID Panel (Reflexed)     Status: Abnormal   Collection Time: 04/23/21  6:49 PM  Result Value Ref Range Status   Enterococcus faecalis NOT DETECTED NOT DETECTED Final   Enterococcus Faecium NOT DETECTED NOT DETECTED Final   Listeria monocytogenes NOT DETECTED NOT DETECTED Final   Staphylococcus species NOT DETECTED NOT DETECTED Final   Staphylococcus aureus (BCID) NOT DETECTED NOT DETECTED Final   Staphylococcus epidermidis NOT DETECTED NOT DETECTED Final   Staphylococcus lugdunensis NOT DETECTED NOT DETECTED Final   Streptococcus species NOT DETECTED NOT DETECTED Final   Streptococcus agalactiae NOT DETECTED NOT DETECTED Final   Streptococcus pneumoniae NOT DETECTED NOT DETECTED Final   Streptococcus pyogenes NOT DETECTED NOT DETECTED Final   A.calcoaceticus-baumannii NOT DETECTED NOT DETECTED Final   Bacteroides fragilis NOT DETECTED NOT DETECTED Final   Enterobacterales NOT DETECTED NOT DETECTED Final   Enterobacter cloacae complex NOT DETECTED NOT DETECTED Final   Escherichia coli NOT DETECTED NOT DETECTED Final   Klebsiella aerogenes NOT DETECTED NOT DETECTED Final   Klebsiella oxytoca NOT DETECTED NOT DETECTED Final   Klebsiella pneumoniae NOT DETECTED NOT DETECTED Final   Proteus species NOT DETECTED NOT DETECTED Final   Salmonella species NOT DETECTED NOT DETECTED Final   Serratia marcescens NOT DETECTED NOT DETECTED Final    Haemophilus influenzae DETECTED (A) NOT DETECTED Final    Comment: CRITICAL RESULT CALLED TO, READ BACK BY AND VERIFIED WITH: V BRYK PHARMD 04/25/21 0507 JDW    Neisseria meningitidis NOT DETECTED NOT DETECTED Final   Pseudomonas aeruginosa NOT DETECTED NOT DETECTED Final   Stenotrophomonas maltophilia NOT DETECTED NOT DETECTED Final   Candida albicans NOT DETECTED NOT DETECTED Final   Candida auris NOT DETECTED NOT DETECTED Final   Candida glabrata NOT DETECTED NOT DETECTED Final   Candida krusei NOT DETECTED NOT DETECTED Final   Candida parapsilosis NOT DETECTED NOT DETECTED Final   Candida tropicalis NOT DETECTED NOT DETECTED Final   Cryptococcus neoformans/gattii NOT DETECTED NOT DETECTED Final    Comment: Performed at [facility], [address], [place], [place] [address]  Resp Panel by RT-PCR (Flu A&B, Covid) Nasopharyngeal Swab     Status: Abnormal   Collection Time: 04/23/21  6:52 PM   Specimen: Nasopharyngeal Swab; Nasopharyngeal(NP) swabs in vial transport medium  Result Value Ref Range Status   SARS Coronavirus 2 by RT PCR NEGATIVE NEGATIVE Final    Comment: (NOTE) SARS-CoV-2 target nucleic acids are NOT DETECTED.  The SARS-CoV-2 RNA is generally detectable in upper respiratory specimens during the acute phase of infection. The lowest concentration of SARS-CoV-2 viral copies this assay can detect is 138 copies/mL. A negative result does not preclude SARS-Cov-2 infection and should not be used as the sole basis for treatment or other patient management decisions. A negative result may occur with  improper specimen collection/handling, submission of specimen other than  nasopharyngeal swab, presence of viral mutation(s) within the areas targeted by this assay, and inadequate number of viral copies(<138 copies/mL). A negative result must be combined with clinical observations, patient history, and epidemiological information. The expected result is Negative.  Fact  Sheet for Patients:  EntrepreneurPulse.com.au  Fact Sheet for Healthcare Providers:  IncredibleEmployment.be  This test is no t yet approved or cleared by the [place] FDA and  has been authorized for detection and/or diagnosis of SARS-CoV-2 by FDA under an Emergency Use Authorization (EUA). This EUA will remain  in effect (meaning this test can be used) for the duration of the COVID-19 declaration under Section 564(b)(1) of the Act, 21 U.S.C.section 360bbb-3(b)(1), unless the authorization is terminated  or revoked sooner.       Influenza A by PCR POSITIVE (A) NEGATIVE Final   Influenza B by PCR NEGATIVE NEGATIVE Final    Comment: (NOTE) The Xpert Xpress SARS-CoV-2/FLU/RSV plus assay is intended as an aid in the diagnosis of influenza from Nasopharyngeal swab specimens and should not be used as a sole basis for treatment. Nasal washings and aspirates are unacceptable for Xpert Xpress SARS-CoV-2/FLU/RSV testing.  Fact Sheet for Patients: EntrepreneurPulse.com.au  Fact Sheet for Healthcare Providers: IncredibleEmployment.be  This test is not yet approved or cleared by the [place] FDA and has been authorized for detection and/or diagnosis of SARS-CoV-2 by FDA under an Emergency Use Authorization (EUA). This EUA will remain in effect (meaning this test can be used) for the duration of the COVID-19 declaration under Section 564(b)(1) of the Act, 21 U.S.C. section 360bbb-3(b)(1), unless the authorization is terminated or revoked.  Performed at [facility], [address], [place], [place] [address]      SIGNED:   Charlynne Cousins, MD  [facility] 04/29/2021, 9:30 AM Pager   If 7PM-7AM, please contact night-coverage [email or web address] Password [email or web address]

## 2021-04-29 NOTE — Progress Notes (Signed)
Inpatient Rehabilitation Admission Medication Review by a Pharmacist  A complete drug regimen review was completed for this patient to identify any potential clinically significant medication issues.  High Risk Drug Classes Is patient taking? Indication by Medication  Antipsychotic No   Anticoagulant Yes Lovenox- VTE prophylaxis  Antibiotic Yes Augmentin- pneumonia (1 more day of therapy needed ending 04/30/2021)  Opioid No   Antiplatelet Yes Aspirin- CVA prophylaxis  Hypoglycemics/insulin No   Vasoactive Medication Yes Metoprolol- tachycardia  Chemotherapy No   Other No      Type of Medication Issue Identified Description of Issue Recommendation(s)  Drug Interaction(s) (clinically significant)     Duplicate Therapy     Allergy     No Medication Administration End Date     Incorrect Dose     Additional Drug Therapy Needed     Significant med changes from prior encounter (inform family/care partners about these prior to discharge).    Other       Clinically significant medication issues were identified that warrant physician communication and completion of prescribed/recommended actions by midnight of the next day:  No   Time spent performing this drug regimen review (minutes):  30   Kalliopi Coupland BS, PharmD, BCPS Clinical Pharmacist    04/29/2021 3:20 PM

## 2021-04-29 NOTE — Progress Notes (Signed)
Inpatient Rehab Admissions Coordinator:  ° °I have a bed for this Pt. On CIR today. RN may call report to [phone] after 12pm. ° °Anatole Apollo, MS, CCC-SLP °Rehab Admissions Coordinator  °[phone] (celll) °[phone] (office) ° °

## 2021-04-30 DIAGNOSIS — L8990 Pressure ulcer of unspecified site, unspecified stage: Secondary | ICD-10-CM | POA: Insufficient documentation

## 2021-04-30 DIAGNOSIS — R5381 Other malaise: Secondary | ICD-10-CM | POA: Diagnosis not present

## 2021-04-30 LAB — URINALYSIS, ROUTINE W REFLEX MICROSCOPIC
Bilirubin Urine: NEGATIVE
Glucose, UA: NEGATIVE mg/dL
Hgb urine dipstick: NEGATIVE
Ketones, ur: NEGATIVE mg/dL
Nitrite: NEGATIVE
Protein, ur: NEGATIVE mg/dL
Specific Gravity, Urine: 1.017 (ref 1.005–1.030)
WBC, UA: >50 WBC/hpf — ABNORMAL HIGH (ref 0–5)
pH: 5 (ref 5.0–8.0)

## 2021-04-30 MED ORDER — CEPHALEXIN 250 MG PO CAPS
500.0000 mg | ORAL_CAPSULE | Freq: Two times a day (BID) | ORAL | Status: AC
  Administered 2021-04-30 – 2021-05-05 (×10): 500 mg via ORAL
  Filled 2021-04-30 (×10): qty 2

## 2021-04-30 MED ORDER — GUAIFENESIN-DM 100-10 MG/5ML PO SYRP
5.0000 mL | ORAL_SOLUTION | ORAL | Status: DC | PRN
  Administered 2021-05-07 (×2): 5 mL via ORAL
  Filled 2021-04-30 (×2): qty 5

## 2021-04-30 NOTE — Progress Notes (Signed)
PROGRESS NOTE   Subjective/Complaints: Coughing a lot Appreciate communication by Jonny Ruiz SLP- he reached out to patient's son who felt her current cognition is similar to baseline  ROS: +cough   Objective:   No results found. Recent Labs    04/28/21 0140 04/29/21 0234  WBC 10.7* 9.1  HGB 10.3* 10.5*  HCT 32.1* 34.0*  PLT 269 245   Recent Labs    04/28/21 0140 04/29/21 0234  NA 136 135  K 4.4 5.0  CL 106 106  CO2 25 21*  GLUCOSE 109* 97  BUN 19 20  CREATININE 0.48 0.49  CALCIUM 7.9* 7.9*    Intake/Output Summary (Last 24 hours) at 04/30/2021 1509 Last data filed at 04/29/2021 1800 Gross per 24 hour  Intake 120 ml  Output --  Net 120 ml     Pressure Injury 04/29/21 Buttocks Left Stage 2 -  Partial thickness loss of dermis presenting as a shallow open injury with a red, pink wound bed without slough. healing s1 blanchable as of 12/30 (Active)  04/29/21 1540  Location: Buttocks  Location Orientation: Left  Staging: Stage 2 -  Partial thickness loss of dermis presenting as a shallow open injury with a red, pink wound bed without slough.  Wound Description (Comments): healing s1 blanchable as of 12/30  Present on Admission: Yes     Pressure Injury 04/29/21 Buttocks Right Stage 2 -  Partial thickness loss of dermis presenting as a shallow open injury with a red, pink wound bed without slough. healing s2, blanchable as of 12/30 (Active)  04/29/21 1541  Location: Buttocks  Location Orientation: Right  Staging: Stage 2 -  Partial thickness loss of dermis presenting as a shallow open injury with a red, pink wound bed without slough.  Wound Description (Comments): healing s2, blanchable as of 12/30  Present on Admission: Yes    Physical Exam: Vital Signs Blood pressure (!) 158/55, pulse 88, temperature 97.7 F (36.5 C), resp. rate (!) 22, height 5' (1.524 m), weight 75 kg, SpO2 98 %. Gen: no distress, normal  appearing HEENT: oral mucosa pink and moist, NCAT Cardio: Reg rate Chest: tachypneic, coughing, +rhonchi Abd: soft, non-distended Musculoskeletal:     Right lower leg: Edema present.     Left lower leg: Edema present.  Skin:    General: Skin is warm.     Comments: Chronic changes in LE's, mild.   Neurological:     Comments: Patient is alert.  Sitting up in chair.  Oriented x3 and follows commands. Some delays in processing. Speech dysarthric/low volume. Able to provide biographical information. UE Motor 4/5. LE: 3/5 prox to 4/5 distally. No focal sensory abnl. No limb ataxia  Psychiatric:     Comments: Flat, slow to engage but cooperative.    Assessment/Plan: 1. Functional deficits which require 3+ hours per day of interdisciplinary therapy in a comprehensive inpatient rehab setting. Physiatrist is providing close team supervision and 24 hour management of active medical problems listed below. Physiatrist and rehab team continue to assess barriers to discharge/monitor patient progress toward functional and medical goals  Care Tool:  Bathing    Body parts bathed by patient: Right arm, Left arm,  Chest, Abdomen, Front perineal area, Buttocks, Right upper leg, Left upper leg, Right lower leg, Left lower leg, Face         Bathing assist Assist Level: Minimal Assistance - Patient > 75%     Upper Body Dressing/Undressing Upper body dressing   What is the patient wearing?: Pull over shirt    Upper body assist Assist Level: Supervision/Verbal cueing    Lower Body Dressing/Undressing Lower body dressing      What is the patient wearing?: Pants, Incontinence brief     Lower body assist Assist for lower body dressing: Moderate Assistance - Patient 50 - 74%     Toileting Toileting    Toileting assist Assist for toileting: Maximal Assistance - Patient 25 - 49%     Transfers Chair/bed transfer  Transfers assist     Chair/bed transfer assist level: Moderate Assistance -  Patient 50 - 74%     Locomotion Ambulation   Ambulation assist      Assist level: Minimal Assistance - Patient > 75% Assistive device: Walker-rolling Max distance: 5 feet   Walk 10 feet activity   Assist  Walk 10 feet activity did not occur: Safety/medical concerns        Walk 50 feet activity   Assist Walk 50 feet with 2 turns activity did not occur: Safety/medical concerns         Walk 150 feet activity   Assist Walk 150 feet activity did not occur: Safety/medical concerns         Walk 10 feet on uneven surface  activity   Assist Walk 10 feet on uneven surfaces activity did not occur: Safety/medical concerns         Wheelchair     Assist Is the patient using a wheelchair?: No Type of Wheelchair: Manual Wheelchair activity did not occur: Safety/medical concerns         Wheelchair 50 feet with 2 turns activity    Assist    Wheelchair 50 feet with 2 turns activity did not occur: Safety/medical concerns       Wheelchair 150 feet activity     Assist  Wheelchair 150 feet activity did not occur: Safety/medical concerns       Blood pressure (!) 158/55, pulse 88, temperature 97.7 F (36.5 C), resp. rate (!) 22, height 5' (1.524 m), weight 75 kg, SpO2 98 %.  Medical Problem List and Plan: 1. Functional deficits. debility secondary to pneumonia/acute respiratory failure.  Pt also with resolving metabolic encephalopathy             -patient may shower             -ELOS/Goals: 7-10 days/ mod I for basic mobility and self-care  Initial CIR evaluations today 2.  Antithrombotics: -DVT/anticoagulation:  Pharmaceutical: Lovenox             -antiplatelet therapy: Aspirin 81 mg daily 3. Pain Management: Tylenol as needed 4. Mood: Not emotional support             -antipsychotic agents: N/A 5. Neuropsych: This patient is capable of making decisions on her own behalf. 6. Skin/Wound Care: Routine skin checks 7.  Fluids/Electrolytes/Nutrition: encourage PO             -pt is edentulous. I asked her if she would like chopped food and she said yes--->ordered D3 diet to start 8.  SVT.  Patient did initially require IV adenosine successful cardioversion.  Continue Lopressor 50 mg twice daily. Cardiology services follow-up             -  monitor HR with activities 9.  Constipation.  Continue MiraLAX 10. Pneumonia: complete course of tamiflu and augmentin             -wean from oxygen as able             -IS, OOB  Start robitussin DM q4H prn.  11. Cognitive impairment: placed nursing order to request assistance with meals.     LOS: 1 days A FACE TO FACE EVALUATION WAS PERFORMED  Martha Clan P Lanee Chain 04/30/2021, 3:09 PM

## 2021-04-30 NOTE — Progress Notes (Signed)
Patient ID: Brooke Harrington, female   DOB: 1941/02/12, 80 y.o.   MRN: [id number]   Pt arrived to 5C02. Droplet precautions implemented upon arrival. Pt and family oriented to rehab, policies reviewed. Pt and family in agreement. Pt A+Ox4. Vitals obtained and assessment completed. No complications noted. Mylo Red, LPN

## 2021-04-30 NOTE — Evaluation (Signed)
Speech Language Pathology Assessment and Plan  Patient Details  Name: Brooke Harrington MRN: [id number] Date of Birth: Aug 25, 1940  SLP Diagnosis: Cognitive Impairments  Rehab Potential: Good ELOS: 7-10 days    Today's Date: 04/30/2021 SLP Individual Time: 3710-6269 SLP Individual Time Calculation (min): 45 min   Hospital Problem: Principal Problem:   Debility  Past Medical History:  Past Medical History:  Diagnosis Date   Hypertension    Past Surgical History:  Past Surgical History:  Procedure Laterality Date   HAND SURGERY      Assessment / Plan / Recommendation  Brooke Harrington is an 80 year old right-handed female with history of hypertension, hyperlipidemia.  Per chart review patient lives with her children.  1 level home level entry.  Modified independent prior to admission.  She can do simple meal preparations and ADLs.  Presented 04/23/2021 with increasing cough x2 days and mild slurring of speech.  CT/MRI showed no acute intracranial abnormality.  Chest x-ray concerning for pneumonia in left mid to lower lung field.  Admission chemistry sodium 132 potassium 3.0 chloride 94 glucose 125 BUN 54 creatinine 1.04, lactic acid 1.9, WBC 12,000 blood cultures haemophilus influenza detected, BNP 369, troponin 452-416.  Patient was placed on Tamiflu.  There was concern for secondary bacterial infection due to many days of symptoms placed on ceftriaxone as well as doxycycline changed to Augmentin 04/29/2021.  Echocardiogram ejection fraction of 70 to 75% no wall motion abnormalities grade 1 diastolic dysfunction.  Lovenox added for DVT prophylaxis.  Hospital course bouts of SVT with rate 485 systolic blood pressure in the 70s she did initially receive IV adenosine with successful cardioversion and currently remains on Lopressor.  Tolerating a regular diet.  Therapy evaluations completed due to patient decreased functional mobility was admitted for a comprehensive rehab program.  Clinical  Impression  Patient presents with a mild-moderate cognitive impairment however after talking on phone with her son Brooke Harrington, she may not be far from her baseline. During evaluation, patient's affect was very flat, voice was monotone and she appeared apathetic overall. She participated in cognitive testing via the SLUMS ([place] Mental Status exam). She was not able to complete clock drawing or shape identification task as she has cataracts which are impacting her vision significantly. 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 She was not able to recall any of the 5 words after a 2 minute delay and she did not appear to try/attempt to recall them after telling SLP "I dont remember". When SLP discussed with patient potential therapy to work on memory, she appeared apathetic and mentioning how people her age are often tested and told they have "Alzheimer's." SLP assured patient this was not the intent and got her permission to call and speak with her son Brooke Harrington on phone. Brooke Harrington reported that initially there was some concern regarding her speech sounding slurred, but that he feels she is not too far off cognitively from her baseline. SLP did inform Brooke Harrington that likely would not start speech therapy with patient based on his report of her baseline, however SLP reconsidered and decided it would be beneficial to work with patient at least for the short term with focus on attention, problem solving, basic recall. Patient will benefit from skilled SLP intervention to maximize cognitive function prior to discharge.  Skilled Therapeutic Interventions          SLUMS, SLE  SLP Assessment  Patient will need skilled [facility] during CIR admission    Recommendations  Patient destination: Home Follow up Recommendations: 24 hour supervision/assistance;None Equipment Recommended: None  recommended by SLP    SLP Frequency 3 to 5 out of 7 days   SLP Duration  SLP Intensity  SLP Treatment/Interventions 7-10 days  Minumum of 1-2 x/day, 30 to 90 minutes  Cognitive remediation/compensation;Internal/external aids;Medication managment;Cueing hierarchy;Patient/family education;Functional tasks    Pain Pain Assessment Pain Scale: 0-10 Pain Score: 0-No pain  Prior Functioning Cognitive/Linguistic Baseline: Within functional limits Type of Home: House Available Help at Discharge: Family;Available PRN/intermittently Education: HS grad, Junior college Vocation: Retired (worked in Marine scientist)  SLP Evaluation Cognition Overall Cognitive Status: Difficult to assess (SLP spoke with son Brooke Harrington on phone and he did not feel she was far off her cognitive baseline) Arousal/Alertness: Awake/alert Orientation Level: Oriented X4 Year: 2022 Month: December Day of Week: Correct Attention: Sustained Sustained Attention: Impaired Sustained Attention Impairment: Verbal complex;Functional basic Memory: Impaired Memory Impairment: Storage deficit;Other (comment) (did not recall any of the 5 words after approximately 2 minute delay) Awareness: Impaired Awareness Impairment: Emergent impairment Problem Solving: Impaired Problem Solving Impairment: Verbal complex Executive Function: Initiating Initiating: Impaired Initiating Impairment: Functional basic;Verbal complex Behaviors: Other (comment) (appears somewhat apathetic) Safety/Judgment: Impaired  Comprehension Auditory Comprehension Overall Auditory Comprehension: Appears within functional limits for tasks assessed Expression Expression Primary Mode of Expression: Verbal Verbal Expression Overall Verbal Expression: Appears within functional limits for tasks assessed Oral Motor Oral Motor/Sensory Function Overall Oral Motor/Sensory Function: Within functional limits Motor Speech Overall Motor Speech: Appears within  functional limits for tasks assessed Respiration: Within functional limits Resonance: Within functional limits Articulation: Within functional limitis Intelligibility: Intelligible Motor Planning: Witnin functional limits Motor Speech Errors: Not applicable  Care Tool Care Tool Cognition Ability to hear (with hearing aid or hearing appliances if normally used Ability to hear (with hearing aid or hearing appliances if normally used): 1. Minimal difficulty - difficulty in some environments (e.g. when person speaks softly or setting is noisy)   Expression of Ideas and Wants Expression of Ideas and Wants: 3. Some difficulty - exhibits some difficulty with expressing needs and ideas (e.g, some words or finishing thoughts) or speech is not clear   Understanding Verbal and Non-Verbal Content Understanding Verbal and Non-Verbal Content: 3. Usually understands - understands most conversations, but misses some part/intent of message. Requires cues at times to understand  Memory/Recall Ability Memory/Recall Ability : Current season;That he or she is in a hospital/hospital unit    Short Term Goals: Week 1: SLP Short Term Goal 1 (Week 1): Patient will demonstrate adequate problem solving when completing functional tasks/ADL's with minA verbal and visual cues. SLP Short Term Goal 2 (Week 1): Patient will be able to recall therapeutic interventions, strategies/recommendations with minA verbal cues. SLP Short Term Goal 3 (Week 1): Patient will demonstrate adequate level of awareness to errors and at least attempt to correct during functional tasks and with minA verbal and demonstration cues. SLP Short Term Goal 4 (Week 1): Patient will initiate to make requests for help/assistance and ask appropriate questions during completion of functional tasks, with minA verbal cues.  Refer to Care Plan for Long Term Goals  Recommendations for other services: None   Discharge Criteria: Patient will be discharged from  SLP if patient refuses treatment 3 consecutive times without medical reason, if treatment goals not met, if there is a change in medical status, if patient makes no progress towards goals or  if patient is discharged from hospital.  The above assessment, treatment plan, treatment alternatives and goals were discussed and mutually agreed upon: by patient  Sonia Baller, MA, CCC-SLP Speech Therapy

## 2021-04-30 NOTE — Progress Notes (Signed)
MD Raulkar notified of pt c/o dysuria. Ua/CX ordered per MD.  Mylo Red, LPN

## 2021-04-30 NOTE — Evaluation (Signed)
Physical Therapy Assessment and Plan  Patient Details  Name: Brooke Harrington MRN: [id number] Date of Birth: October 15, 1940  PT Diagnosis: Abnormality of gait, Cognitive deficits, Difficulty walking, Impaired cognition, Muscle weakness, Osteoarthritis, and Pain in back Rehab Potential: Good ELOS: 10-12 days   Today's Date: 04/30/2021 PT Individual Time: 1035-1130 PT Individual Time Calculation (min): 55 min    Hospital Problem: Principal Problem:   Debility   Past Medical History:  Past Medical History:  Diagnosis Date   Hypertension    Past Surgical History:  Past Surgical History:  Procedure Laterality Date   HAND SURGERY      Assessment & Plan Clinical Impression: Patient is a 80 y.o. right-handed female with history of hypertension, hyperlipidemia.  Per chart review patient lives with her children.  1 level home level entry.  Modified independent prior to admission.  She can do simple meal preparations and ADLs.  Presented 04/23/2021 with increasing cough x2 days and mild slurring of speech.  CT/MRI showed no acute intracranial abnormality.  Chest x-ray concerning for pneumonia in left mid to lower lung field.  Admission chemistry sodium 132 potassium 3.0 chloride 94 glucose 125 BUN 54 creatinine 1.04, lactic acid 1.9, WBC 12,000 blood cultures haemophilus influenza detected, BNP 369, troponin 452-416.  Patient was placed on Tamiflu.  There was concern for secondary bacterial infection due to many days of symptoms placed on ceftriaxone as well as doxycycline changed to Augmentin 04/29/2021.  Echocardiogram ejection fraction of 70 to 75% no wall motion abnormalities grade 1 diastolic dysfunction.  Lovenox added for DVT prophylaxis.  Hospital course bouts of SVT with rate 209 systolic blood pressure in the 70s she did initially receive IV adenosine with successful cardioversion and currently remains on Lopressor.  Tolerating a regular diet.  Therapy evaluations completed due to patient  decreased functional mobility was admitted for a comprehensive rehab program.  Patient transferred to CIR on 04/29/2021 .   Patient currently requires min to Mod assist with mobility secondary to muscle weakness, decreased cardiorespiratoy endurance, decreased visual acuity, decreased initiation, decreased awareness, decreased problem solving, and decreased safety awareness, ,, and decreased standing balance, decreased postural control, and decreased balance strategies.  Prior to hospitalization, patient was modified independent  with mobility and lived with Family (son and daughter in [place]) in a House home.  Home access is  Level entry.  Patient will benefit from skilled PT intervention to maximize safe functional mobility, minimize fall risk, and decrease caregiver burden for planned discharge home with 24 hour supervision.  Anticipate patient will benefit from follow up [place] at discharge.  PT - End of Session Activity Tolerance: Tolerates 30+ min activity with multiple rests Endurance Deficit: Yes Endurance Deficit Description: Pt needed frequent rest breaks during routine ADL activity PT Assessment Rehab Potential (ACUTE/IP ONLY): Good PT Barriers to Discharge: Decreased caregiver support;Home environment access/layout;Incontinence;Insurance for SNF coverage;Weight;Medication compliance;New oxygen;Other (comments) PT Patient demonstrates impairments in the following area(s): Balance;Behavior;Edema;Endurance;Motor;Perception;Safety;Sensory PT Transfers Functional Problem(s): Bed Mobility;Bed to Chair;Car;Furniture PT Locomotion Functional Problem(s): Ambulation;Wheelchair Mobility;Stairs PT Plan PT Intensity: Minimum of 1-2 x/day ,45 to 90 minutes PT Frequency: 5 out of 7 days PT Duration Estimated Length of Stay: 10-12 days PT Treatment/Interventions: Ambulation/gait training;Balance/vestibular training;Cognitive remediation/compensation;Disease management/prevention;Discharge planning;Community  reintegration;DME/adaptive equipment instruction;Functional mobility training;Patient/family education;Neuromuscular re-education;Psychosocial support;Stair training;UE/LE Strength taining/ROM;Wheelchair propulsion/positioning;UE/LE Coordination activities;Therapeutic Activities;Skin care/wound management;Therapeutic Exercise;Visual/perceptual remediation/compensation PT Transfers Anticipated Outcome(s): Mod I/ supervision PT Locomotion Anticipated Outcome(s): Supervision PT Recommendation Recommendations for Other Services: Therapeutic Recreation consult Therapeutic Recreation Interventions: Pet therapy;Kitchen  group;Stress management Follow Up Recommendations: Home health PT Patient destination: Home Equipment Recommended: To be determined   PT Evaluation Precautions/Restrictions Precautions Precautions: Fall Precaution Comments: 2L O2 via [place], low activity tolerance, impaired vision due to cataracts Restrictions Weight Bearing Restrictions: No General   Vital Signs Pain Pain Assessment Pain Scale: 0-10 Pain Score: 0-No pain Pain Interference Pain Interference Pain Effect on Sleep: 1. Rarely or not at all Pain Interference with Therapy Activities: 1. Rarely or not at all Pain Interference with Day-to-Day Activities: 1. Rarely or not at all Home Living/Prior [place] Available Help at Discharge: Family;Available PRN/intermittently Type of Home: House Home Access: Level entry Home Layout: One level Bathroom Shower/Tub: Chiropodist: Standard Bathroom Accessibility: Yes  Lives With: Family (son and daughter in [place]) Prior Function Level of Independence: Independent with basic ADLs;Independent with homemaking with ambulation Driving: No (due to cataracts/visual impairments) Vocation: Retired Vision/Perception  Vision - History Ability to See in Adequate Light: 2 Moderately impaired Vision - Assessment Additional Comments: cataracts, blurry  vision Perception Perception: Within Functional Limits Praxis Praxis: Intact  Cognition Overall Cognitive Status: Difficult to assess Arousal/Alertness: Awake/alert Orientation Level: Oriented to person;Oriented to place;Oriented to situation Year: 2022 Month: December Day of Week: Correct Attention: Sustained Sustained Attention: Impaired Sustained Attention Impairment: Verbal complex;Functional basic Memory: Impaired Memory Impairment: Storage deficit;Other (comment) (did not recall any of the 5 words after approximately 2 minute delay) Immediate Memory Recall: Sock;Blue;Bed Memory Recall Sock: Without Cue Memory Recall Blue: With Cue Memory Recall Bed: With Cue Awareness: Impaired Awareness Impairment: Emergent impairment Problem Solving: Impaired Problem Solving Impairment: Verbal complex Executive Function: Initiating;Reasoning Reasoning: Impaired Initiating: Impaired Initiating Impairment: Functional basic;Verbal complex Behaviors: Other (comment) (appears somewhat apathetic) Safety/Judgment: Impaired Sensation Sensation Light Touch: Appears Intact Coordination Gross Motor Movements are Fluid and Coordinated: Yes Fine Motor Movements are Fluid and Coordinated: Yes Finger Nose Finger Test: Mild dysmetria bilaterally Heel Shin Test: WFL bilaterally, just slow d/t fatigue Motor  Motor Motor: Other (comment) Motor - Skilled Clinical Observations: Generalized weakness, slow to initiate, participation limited by fatigue   Trunk/Postural Assessment  Cervical Assessment Cervical Assessment: Exceptions to [facility] (forward head) Thoracic Assessment Thoracic Assessment: Exceptions to [facility] (rounded shoulders) Lumbar Assessment Lumbar Assessment: Exceptions to [facility] (posterior pelvic tilt) Postural Control Postural Control: Within Functional Limits  Balance Balance Balance Assessed: Yes Static Sitting Balance Static Sitting - Balance Support: Bilateral upper extremity  supported;Feet supported Static Sitting - Level of Assistance: 5: Stand by assistance Dynamic Sitting Balance Dynamic Sitting - Balance Support: During functional activity;No upper extremity supported Dynamic Sitting - Level of Assistance: 5: Stand by assistance (trying to thread LEs into pants) Dynamic Sitting Balance - Compensations: Dynamic standing: CGA during LB dressing Static Standing Balance Static Standing - Balance Support: Bilateral upper extremity supported;During functional activity Static Standing - Level of Assistance: 4: Min assist Extremity Assessment  RUE Assessment RUE Assessment: Within Functional Limits Active Range of Motion (AROM) Comments: WNL LUE Assessment LUE Assessment: Within Functional Limits Active Range of Motion (AROM) Comments: WNL RLE Assessment RLE Assessment: Exceptions to [facility] RLE Strength RLE Overall Strength: Deficits Right Hip Flexion: 3-/5 Right Hip Extension: 3-/5 Right Hip ABduction: 3/5 Right Hip ADduction: 3+/5 Right Knee Flexion: 3-/5 Right Knee Extension: 3+/5 Right Ankle Dorsiflexion: 3+/5 Right Ankle Plantar Flexion: 3+/5 LLE Assessment LLE Assessment: Exceptions to [facility] LLE Strength LLE Overall Strength: Deficits Left Hip Flexion: 3-/5 Left Hip Extension: 3-/5 Left Hip ABduction: 3/5 Left Hip ADduction: 3+/5 Left Knee  Flexion: 3-/5 Left Knee Extension: 3+/5 Left Ankle Dorsiflexion: 3+/5 Left Ankle Plantar Flexion: 3+/5  Care Tool Care Tool Bed Mobility Roll left and right activity   Roll left and right assist level: Minimal Assistance - Patient > 75%    Sit to lying activity   Sit to lying assist level: Minimal Assistance - Patient > 75%    Lying to sitting on side of bed activity   Lying to sitting on side of bed assist level: the ability to move from lying on the back to sitting on the side of the bed with no back support.: Moderate Assistance - Patient 50 - 74%     Care Tool Transfers Sit to stand transfer    Sit to stand assist level: Minimal Assistance - Patient > 75%    Chair/bed transfer   Chair/bed transfer assist level: Moderate Assistance - Patient 50 - 74%     Toilet transfer   Assist Level: Minimal Assistance - Patient > 75%    Car transfer Car transfer activity did not occur: Safety/medical concerns        Care Tool Locomotion Ambulation   Assist level: Minimal Assistance - Patient > 75% Assistive device: Walker-rolling Max distance: 5 feet  Walk 10 feet activity Walk 10 feet activity did not occur: Safety/medical concerns       Walk 50 feet with 2 turns activity Walk 50 feet with 2 turns activity did not occur: Safety/medical concerns      Walk 150 feet activity Walk 150 feet activity did not occur: Safety/medical concerns      Walk 10 feet on uneven surfaces activity Walk 10 feet on uneven surfaces activity did not occur: Safety/medical concerns      Stairs Stair activity did not occur: Safety/medical concerns        Walk up/down 1 step activity Walk up/down 1 step or curb (drop down) activity did not occur: Safety/medical concerns      Walk up/down 4 steps activity Walk up/down 4 steps activity did not occur: Safety/medical concerns      Walk up/down 12 steps activity Walk up/down 12 steps activity did not occur: Safety/medical concerns      Pick up small objects from floor Pick up small object from the floor (from standing position) activity did not occur: Safety/medical concerns      Wheelchair Is the patient using a wheelchair?: No Type of Wheelchair: Manual Wheelchair activity did not occur: Safety/medical concerns      Wheel 50 feet with 2 turns activity Wheelchair 50 feet with 2 turns activity did not occur: Safety/medical concerns    Wheel 150 feet activity Wheelchair 150 feet activity did not occur: Safety/medical concerns      Refer to Care Plan for Long Term Goals  SHORT TERM GOAL WEEK 1 PT Short Term Goal 1 (Week 1): Pt will demonstrate  consistent CGA with performance of bed mobility. PT Short Term Goal 2 (Week 1): Pt will perform all functional standing transfers at [facility]. PT Short Term Goal 3 (Week 1): Pt will initiate curb step training. PT Short Term Goal 4 (Week 1): Pt will ambulate 25' consistently with CGA and w/c follow for fatigue.  Recommendations for other services: Therapeutic Recreation  Pet therapy, Kitchen group, and Outing/community reintegration  Skilled Therapeutic Intervention Mobility Bed Mobility Bed Mobility: Rolling Right;Right Sidelying to Sit;Sit to Supine Rolling Right: Contact Guard/Touching assist Right Sidelying to Sit: Moderate Assistance - Patient 50-74% Sit to Supine: Minimal Assistance - Patient >  75% Transfers Transfers: Sit to Stand;Stand to Sit;Stand Pivot Transfers Sit to Stand: Minimal Assistance - Patient > 75% Stand to Sit: Contact Guard/Touching assist Stand Pivot Transfers: Minimal Assistance - Patient > 75% Stand Pivot Transfer Details: Tactile cues for initiation;Verbal cues for technique;Verbal cues for precautions/safety;Verbal cues for gait pattern;Verbal cues for safe use of DME/AE Transfer (Assistive device): Rolling walker Locomotion  Gait Ambulation: Yes Gait Assistance: Minimal Assistance - Patient > 75% Gait Distance (Feet): 5 Feet Assistive device: Rolling walker Gait Assistance Details: Verbal cues for precautions/safety;Verbal cues for gait pattern;Verbal cues for safe use of DME/AE Stairs / Additional Locomotion Stairs: No Wheelchair Mobility Wheelchair Mobility: No  PT Evaluation completed; see above for results. PT educated patient in roles of PT vs OT, PT POC, rehab potential, rehab goals, and discharge recommendations along with recommendation for follow-up rehabilitation services. Individual treatment initiated:  Patient supine in bed upon PT arrival. Patient alert and agreeable to PT session resting comfortably, although very fatigued. No pain complaint  during session. SpO2 checked prior to session and throughout with levels reaching 93-96% throughout.  Therapeutic Activity: Bed Mobility: Patient performed supine to/from sit with MinA for effort as pt is unable to push self up from bed surface. Provided verbal cues for technique. Transfers: Patient performed sit <> stand requiring MinA for power up and attaining balance once standing. Requires large effort and slightly elevated surface to complete d/t fatigue. Provided vc/tc for technique throughout. Attempt for 5xSTS test performance but pt is unable to perform more than 2x STS and does not complete test.   Gait Training:  Patient ambulated 5  feet prior to request to sit for fatigue. Pt used RW and MinA in attempt to circle her bed but unable to finish distance around d/t fatigue.   Patient supine  in bed at end of session with brakes locked, bed alarm set, and all needs within reach.  Pt extremely fatigued at end of session and cognition changes with pt asking questions of therapist re: her family members and where they were as though therapist was also a family member.   Discharge Criteria: Patient will be discharged from PT if patient refuses treatment 3 consecutive times without medical reason, if treatment goals not met, if there is a change in medical status, if patient makes no progress towards goals or if patient is discharged from hospital.  The above assessment, treatment plan, treatment alternatives and goals were discussed and mutually agreed upon: by patient  Alger Simons 04/30/2021, 1:08 PM

## 2021-04-30 NOTE — Plan of Care (Signed)
°  Problem: RH Balance Goal: LTG Patient will maintain dynamic standing with ADLs (OT) Description: LTG:  Patient will maintain dynamic standing balance with assist during activities of daily living (OT)  Flowsheets (Taken 04/30/2021 1515) LTG: Pt will maintain dynamic standing balance during ADLs with: Supervision/Verbal cueing   Problem: Sit to Stand Goal: LTG:  Patient will perform sit to stand in prep for activites of daily living with assistance level (OT) Description: LTG:  Patient will perform sit to stand in prep for activites of daily living with assistance level (OT) Flowsheets (Taken 04/30/2021 1515) LTG: PT will perform sit to stand in prep for activites of daily living with assistance level: Supervision/Verbal cueing   Problem: RH Eating Goal: LTG Patient will perform eating w/assist, cues/equip (OT) Description: LTG: Patient will perform eating with assist, with/without cues using equipment (OT) Flowsheets (Taken 04/30/2021 1515) LTG: Pt will perform eating with assistance level of: Supervision/Verbal cueing   Problem: RH Bathing Goal: LTG Patient will bathe all body parts with assist levels (OT) Description: LTG: Patient will bathe all body parts with assist levels (OT) Flowsheets (Taken 04/30/2021 1515) LTG: Pt will perform bathing with assistance level/cueing: Supervision/Verbal cueing   Problem: RH Dressing Goal: LTG Patient will perform upper body dressing (OT) Description: LTG Patient will perform upper body dressing with assist, with/without cues (OT). Flowsheets (Taken 04/30/2021 1515) LTG: Pt will perform upper body dressing with assistance level of: Set up assist Goal: LTG Patient will perform lower body dressing w/assist (OT) Description: LTG: Patient will perform lower body dressing with assist, with/without cues in positioning using equipment (OT) Flowsheets (Taken 04/30/2021 1515) LTG: Pt will perform lower body dressing with assistance level of: (excluding  Teds) Supervision/Verbal cueing   Problem: RH Toileting Goal: LTG Patient will perform toileting task (3/3 steps) with assistance level (OT) Description: LTG: Patient will perform toileting task (3/3 steps) with assistance level (OT)  Flowsheets (Taken 04/30/2021 1515) LTG: Pt will perform toileting task (3/3 steps) with assistance level: Supervision/Verbal cueing   Problem: RH Toilet Transfers Goal: LTG Patient will perform toilet transfers w/assist (OT) Description: LTG: Patient will perform toilet transfers with assist, with/without cues using equipment (OT) Flowsheets (Taken 04/30/2021 1515) LTG: Pt will perform toilet transfers with assistance level of: Supervision/Verbal cueing   Problem: RH Tub/Shower Transfers Goal: LTG Patient will perform tub/shower transfers w/assist (OT) Description: LTG: Patient will perform tub/shower transfers with assist, with/without cues using equipment (OT) Flowsheets (Taken 04/30/2021 1515) LTG: Pt will perform tub/shower stall transfers with assistance level of: Supervision/Verbal cueing

## 2021-04-30 NOTE — Progress Notes (Signed)
Have not been able to obtain urine specimen due to pt incontinence. Pt encouraged to call.  Mylo Red, LPN

## 2021-04-30 NOTE — Progress Notes (Signed)
Pt refused to go to chair for supper. Educated pt. Pt in agreement. Mylo Red, LPN

## 2021-05-01 DIAGNOSIS — R5381 Other malaise: Secondary | ICD-10-CM | POA: Diagnosis not present

## 2021-05-01 LAB — URINE CULTURE: Culture: NO GROWTH

## 2021-05-01 MED ORDER — LOPERAMIDE HCL 2 MG PO CAPS
2.0000 mg | ORAL_CAPSULE | ORAL | Status: DC | PRN
  Administered 2021-05-01 – 2021-05-02 (×2): 2 mg via ORAL
  Filled 2021-05-01 (×3): qty 1

## 2021-05-01 MED ORDER — ACETAMINOPHEN 325 MG PO TABS
650.0000 mg | ORAL_TABLET | Freq: Four times a day (QID) | ORAL | Status: DC | PRN
  Administered 2021-05-01 – 2021-05-06 (×3): 650 mg via ORAL
  Filled 2021-05-01 (×4): qty 2

## 2021-05-01 NOTE — Progress Notes (Signed)
PROGRESS NOTE   Subjective/Complaints: Looking much better this morning No longer coughing Brooke Harrington noted diarrhea- imodium ordered Appreciate Brooke Harrington encouraging eating meals in chair last night  ROS: cough- improved, +diarrhea   Objective:   No results found. Recent Labs    04/29/21 0234  WBC 9.1  HGB 10.5*  HCT 34.0*  PLT 245   Recent Labs    04/29/21 0234  NA 135  K 5.0  CL 106  CO2 21*  GLUCOSE 97  BUN 20  CREATININE 0.49  CALCIUM 7.9*    Intake/Output Summary (Last 24 hours) at 05/01/2021 0941 Last data filed at 04/30/2021 2130 Gross per 24 hour  Intake 476 ml  Output 50 ml  Net 426 ml     Pressure Injury 04/29/21 Buttocks Left Stage 2 -  Partial thickness loss of dermis presenting as a shallow open injury with a red, pink wound bed without slough. healing s1 blanchable as of 12/30 (Active)  04/29/21 1540  Location: Buttocks  Location Orientation: Left  Staging: Stage 2 -  Partial thickness loss of dermis presenting as a shallow open injury with a red, pink wound bed without slough.  Wound Description (Comments): healing s1 blanchable as of 12/30  Present on Admission: Yes     Pressure Injury 04/29/21 Buttocks Right Stage 2 -  Partial thickness loss of dermis presenting as a shallow open injury with a red, pink wound bed without slough. healing s2, blanchable as of 12/30 (Active)  04/29/21 1541  Location: Buttocks  Location Orientation: Right  Staging: Stage 2 -  Partial thickness loss of dermis presenting as a shallow open injury with a red, pink wound bed without slough.  Wound Description (Comments): healing s2, blanchable as of 12/30  Present on Admission: Yes    Physical Exam: Vital Signs Blood pressure (!) 154/54, pulse 79, temperature 98 F (36.7 C), resp. rate 20, height 5' (1.524 m), weight 75 kg, SpO2 100 %. Gen: no distress, normal appearing HEENT: oral mucosa pink and moist,  NCAT Cardio: Reg rate Chest: tachypneic, coughing, +rhonchi Abd: soft, non-distended Musculoskeletal:     Right lower leg: Edema present.     Left lower leg: Edema present.  Skin:    General: Skin is warm.     Comments: Chronic changes in LE's, mild.   Neurological:     Comments: Patient is alert.  Impaired reasoning. Sitting up in chair.  Oriented x3 and follows commands. Some delays in processing. Speech dysarthric/low volume. Able to provide biographical information. UE Motor 4/5. LE: 3/5 prox to 4/5 distally. No focal sensory abnl. No limb ataxia  Psychiatric:     Comments: Flat, slow to engage but cooperative.    Assessment/Plan: 1. Functional deficits which require 3+ hours per day of interdisciplinary therapy in a comprehensive inpatient rehab setting. Physiatrist is providing close team supervision and 24 hour management of active medical problems listed below. Physiatrist and rehab team continue to assess barriers to discharge/monitor patient progress toward functional and medical goals  Care Tool:  Bathing    Body parts bathed by patient: Front perineal area         Bathing assist Assist Level: Set  up assist     Upper Body Dressing/Undressing Upper body dressing   What is the patient wearing?: Pull over shirt    Upper body assist Assist Level: Supervision/Verbal cueing    Lower Body Dressing/Undressing Lower body dressing      What is the patient wearing?: Pants, Incontinence brief     Lower body assist Assist for lower body dressing: Moderate Assistance - Patient 50 - 74%     Toileting Toileting    Toileting assist Assist for toileting: Maximal Assistance - Patient 25 - 49%     Transfers Chair/bed transfer  Transfers assist     Chair/bed transfer assist level: Moderate Assistance - Patient 50 - 74%     Locomotion Ambulation   Ambulation assist      Assist level: Minimal Assistance - Patient > 75% Assistive device: Walker-rolling Max  distance: 5 feet   Walk 10 feet activity   Assist  Walk 10 feet activity did not occur: Safety/medical concerns        Walk 50 feet activity   Assist Walk 50 feet with 2 turns activity did not occur: Safety/medical concerns         Walk 150 feet activity   Assist Walk 150 feet activity did not occur: Safety/medical concerns         Walk 10 feet on uneven surface  activity   Assist Walk 10 feet on uneven surfaces activity did not occur: Safety/medical concerns         Wheelchair     Assist Is the patient using a wheelchair?: No Type of Wheelchair: Manual Wheelchair activity did not occur: Safety/medical concerns         Wheelchair 50 feet with 2 turns activity    Assist    Wheelchair 50 feet with 2 turns activity did not occur: Safety/medical concerns       Wheelchair 150 feet activity     Assist  Wheelchair 150 feet activity did not occur: Safety/medical concerns       Blood pressure (!) 154/54, pulse 79, temperature 98 F (36.7 C), resp. rate 20, height 5' (1.524 m), weight 75 kg, SpO2 100 %.  Medical Problem List and Plan: 1. Functional deficits. debility secondary to pneumonia/acute respiratory failure.  Pt also with resolving metabolic encephalopathy             -patient may shower             -ELOS/Goals: 7-10 days/ mod I for basic mobility and self-care  Continue CIR 2.  Impaired mobility: Continue Lovenox             -antiplatelet therapy: Aspirin 81 mg daily 3. Pain Management: Tylenol as needed 4. Mood: Not emotional support             -antipsychotic agents: N/A 5. Neuropsych: This patient is capable of making decisions on her own behalf. 6. Skin/Wound Care: Routine skin checks 7. Fluids/Electrolytes/Nutrition: encourage PO             -pt is edentulous. I asked her if she would like chopped food and she said yes--->ordered D3 diet to start 8.  SVT.  Patient did initially require IV adenosine successful  cardioversion.  Continue Lopressor 50 mg twice daily. Cardiology services follow-up             -monitor HR with activities 9.  Constipation.  Continue MiraLAX 10. Pneumonia: complete course of tamiflu and augmentin             -  wean from oxygen as able             -IS, OOB  Start robitussin DM q4H prn.  11. Cognitive impairment: placed nursing order to request assistance with meals and meals out of bed in chair 12. Diarrhea: start imodium prn    LOS: 2 days A FACE TO FACE EVALUATION WAS PERFORMED  Brooke Harrington 05/01/2021, 9:41 AM

## 2021-05-01 NOTE — Discharge Instructions (Addendum)
Inpatient Rehab Discharge Instructions  Brooke Harrington Discharge date and time: No discharge date for patient encounter.   Activities/Precautions/ Functional Status: Activity: As tolerated Diet: Soft Wound Care: Routine skin checks Functional status:  ___ No restrictions     ___ Walk up steps independently ___ 24/7 supervision/assistance   ___ Walk up steps with assistance ___ Intermittent supervision/assistance  ___ Bathe/dress independently ___ Walk with walker     _x__ Bathe/dress with assistance ___ Walk Independently    ___ Shower independently ___ Walk with assistance    ___ Shower with assistance ___ No alcohol     ___ Return to work/school ________  COMMUNITY REFERRALS UPON DISCHARGE:    Home Health:   PT     OT     ST                   Agency:[facility] Phone: [phone]    Medical Equipment/Items Ordered: Levan Hurst, Transfer Bench, Bedside Commode                                                 Agency/Supplier: [employer] [phone]   Special Instructions: No driving smoking or alcohol   My questions have been answered and I understand these instructions. I will adhere to these goals and the provided educational materials after my discharge from the hospital.  Patient/Caregiver Signature _______________________________ Date __________  Clinician Signature _______________________________________ Date __________  Please bring this form and your medication list with you to all your follow-up doctor's appointments.

## 2021-05-02 DIAGNOSIS — R5381 Other malaise: Secondary | ICD-10-CM | POA: Diagnosis not present

## 2021-05-02 LAB — CBC WITH DIFFERENTIAL/PLATELET
Abs Immature Granulocytes: 0.05 10*3/uL (ref 0.00–0.07)
Basophils Absolute: 0.1 10*3/uL (ref 0.0–0.1)
Basophils Relative: 1 %
Eosinophils Absolute: 0.2 10*3/uL (ref 0.0–0.5)
Eosinophils Relative: 2 %
HCT: 32.4 % — ABNORMAL LOW (ref 36.0–46.0)
Hemoglobin: 10.2 g/dL — ABNORMAL LOW (ref 12.0–15.0)
Immature Granulocytes: 1 %
Lymphocytes Relative: 16 %
Lymphs Abs: 1 10*3/uL (ref 0.7–4.0)
MCH: 28.2 pg (ref 26.0–34.0)
MCHC: 31.5 g/dL (ref 30.0–36.0)
MCV: 89.5 fL (ref 80.0–100.0)
Monocytes Absolute: 0.4 10*3/uL (ref 0.1–1.0)
Monocytes Relative: 7 %
Neutro Abs: 4.7 10*3/uL (ref 1.7–7.7)
Neutrophils Relative %: 73 %
Platelets: 359 10*3/uL (ref 150–400)
RBC: 3.62 MIL/uL — ABNORMAL LOW (ref 3.87–5.11)
RDW: 14.2 % (ref 11.5–15.5)
WBC: 6.5 10*3/uL (ref 4.0–10.5)
nRBC: 0 % (ref 0.0–0.2)

## 2021-05-02 LAB — COMPREHENSIVE METABOLIC PANEL
ALT: 83 U/L — ABNORMAL HIGH (ref 0–44)
AST: 56 U/L — ABNORMAL HIGH (ref 15–41)
Albumin: 1.7 g/dL — ABNORMAL LOW (ref 3.5–5.0)
Alkaline Phosphatase: 57 U/L (ref 38–126)
Anion gap: 3 — ABNORMAL LOW (ref 5–15)
BUN: 14 mg/dL (ref 8–23)
CO2: 31 mmol/L (ref 22–32)
Calcium: 8.1 mg/dL — ABNORMAL LOW (ref 8.9–10.3)
Chloride: 104 mmol/L (ref 98–111)
Creatinine, Ser: 0.59 mg/dL (ref 0.44–1.00)
GFR, Estimated: >60 mL/min (ref >60)
Glucose, Bld: 92 mg/dL (ref 70–99)
Potassium: 4.6 mmol/L (ref 3.5–5.1)
Sodium: 138 mmol/L (ref 135–145)
Total Bilirubin: 0.4 mg/dL (ref 0.3–1.2)
Total Protein: 5.4 g/dL — ABNORMAL LOW (ref 6.5–8.1)

## 2021-05-02 MED ORDER — NYSTATIN 100000 UNIT/GM EX CREA
TOPICAL_CREAM | Freq: Two times a day (BID) | CUTANEOUS | Status: DC
  Filled 2021-05-02: qty 15

## 2021-05-02 NOTE — Progress Notes (Signed)
Physical Therapy Session Note  Patient Details  Name: Brooke Harrington MRN: [id number] Date of Birth: 03-Feb-1941  Today's Date: 05/02/2021 PT Individual Time: 0930-1040 PT Individual Time Calculation (min): 70 min   Short Term Goals: Week 1:  PT Short Term Goal 1 (Week 1): Pt will demonstrate consistent CGA with performance of bed mobility. PT Short Term Goal 2 (Week 1): Pt will perform all functional standing transfers at [facility]. PT Short Term Goal 3 (Week 1): Pt will initiate curb step training. PT Short Term Goal 4 (Week 1): Pt will ambulate 25' consistently with CGA and w/c follow for fatigue.  Skilled Therapeutic Interventions/Progress Updates:  Pt received seated in recliner in room, on 1 L O2, denied pain and was agreeable to PT. Pt very lethargic and confused during session, requiring reorientation and max verbal cues for safe completion of tasks. Emphasis of session on improved activity tolerance and BLE strength. Vitals while seated: SpO2 94%, HR 77 bpm. Pt performed sit <>stand x2 w/CGA to don pants w/total A, required max verbal cues for proper hand positioning for each stand throughout session, as pt demonstrated pulling RW towards her to stand rather than pushing up from seat. Pt ambulated 10' w/RW and min A slowly, noted kyphotic posture, poor AD management, step-to gait pattern, decreased step clearance bilaterally and narrow BOS. Min verbal cues to maintain safe distance to RW, especially during turn to sit in chair, which pt was unable to understand. Provided visual demonstration for pt once seated in [facility] regarding safe use of RW, pt appeared glazed over and was not receptive to education. Pt transferred to 1L portable O2 and transported to [facility] therapy gym w/total A for time management. Sit <>stand pivot from WC to NuStep w/min A due to improper hand placement and poor management of AD while turning. Stand <> sit w/min A for hip guidance to avoid missing seat due to improper pt positioning  and inability to reach back for chair. Pt performed 12 minutes on NuStep level 5 for improved cardiovascular endurance, UE/LE coordination and global conditioning. Pt averaged 19 steps per minute, SPO2 maintained above 93% throughout. Inquired about [employer] RPE, pt reported she is unable to read chart due to cataracts. Read chart aloud to pt, but she reported she was unable to provide a number due to "having no idea". Noted pt demonstrated difficulty maintaining foot position on pedals and was unable to tell when her foot was off pedal, max verbal and tactile cues required to maintain feet in pedals. Sit <>stand pivot from NuStep to [facility] w/RW and min A for AD management and hand placement. Mod verbal cues for navigation as pt became "stuck" and could not see where she was to fix it, suspect severe vision impairment. Pt transported back to room w/total A and requested to return to bed, transferred to 1L wall O2. Sit <>stand pivot from WC to EOB w/RW and min A, pt frequently stepped on O2 line without knowing and could not tell why it was taut. Sit <>supine w/S* and mod verbal cues for sequencing. Pt requested therapist turn on TV for her due to not being able to see buttons. Attempted to teach pt how to press buttons based on size (large button for help), no recall noted. Pt was left supine in bed, all needs in reach. Nursing notified of suspected visual impairment.   Therapy Documentation Precautions:  Precautions Precautions: Fall Precaution Comments: 2L O2 via [place], droplet precautions, impaired vision due to cataracts Restrictions Weight  Bearing Restrictions: No    Therapy/Group: Individual Therapy Jill Alexanders Kavontae Pritchard, PT, DPT  05/02/2021, 7:48 AM

## 2021-05-02 NOTE — Care Management (Signed)
Inpatient Rehabilitation Center Individual Statement of Services  Patient Name:  Brooke Harrington  Date:  05/02/2021  Welcome to the Inpatient Rehabilitation Center.  Our goal is to provide you with an individualized program based on your diagnosis and situation, designed to meet your specific needs.  With this comprehensive rehabilitation program, you will be expected to participate in at least 3 hours of rehabilitation therapies Monday-Friday, with modified therapy programming on the weekends.  Your rehabilitation program will include the following services:  Physical Therapy (PT), Occupational Therapy (OT), Speech Therapy (ST), 24 hour per day rehabilitation nursing, Therapeutic Recreaction (TR), Psychology, Neuropsychology, Care Coordinator, Rehabilitation Medicine, Nutrition Services, Pharmacy Services, and Other  Weekly team conferences will be held on Wednesdays to discuss your progress.  Your Inpatient Rehabilitation Care Coordinator will talk with you frequently to get your input and to update you on team discussions.  Team conferences with you and your family in attendance may also be held.  Expected length of stay: 7-12 days    Overall anticipated outcome: Supervision  Depending on your progress and recovery, your program may change. Your Inpatient Rehabilitation Care Coordinator will coordinate services and will keep you informed of any changes. Your Inpatient Rehabilitation Care Coordinator's name and contact numbers are listed  below.  The following services may also be recommended but are not provided by the Inpatient Rehabilitation Center:  Driving Evaluations Home Health Rehabiltiation Services Outpatient Rehabilitation Services Vocational Rehabilitation   Arrangements will be made to provide these services after discharge if needed.  Arrangements include referral to agencies that provide these services.  Your insurance has been verified to be:  Medicare A/B  Your primary  doctor is:  No PCP listed  Pertinent information will be shared with your doctor and your insurance company.  Inpatient Rehabilitation Care Coordinator:  Lavera Guise, [place] [phone] or [phone]  Information discussed with and copy given to patient by: Gretchen Short, 05/02/2021, 11:57 AM

## 2021-05-02 NOTE — Progress Notes (Signed)
Inpatient Rehabilitation  Patient information reviewed and entered into eRehab system by Labresha Mellor M. Aleksander Edmiston, M.A., CCC/SLP, PPS Coordinator.  Information including medical coding, functional ability and quality indicators will be reviewed and updated through discharge.    

## 2021-05-02 NOTE — Plan of Care (Signed)
°  Problem: RH Balance Goal: LTG Patient will maintain dynamic standing balance (PT) Description: LTG:  Patient will maintain dynamic standing balance with assistance during mobility activities (PT) Flowsheets (Taken 04/30/2021 1405) LTG: Pt will maintain dynamic standing balance during mobility activities with:: Independent with assistive device    Problem: Sit to Stand Goal: LTG:  Patient will perform sit to stand with assistance level (PT) Description: LTG:  Patient will perform sit to stand with assistance level (PT) Flowsheets (Taken 04/30/2021 1405) LTG: PT will perform sit to stand in preparation for functional mobility with assistance level: Independent with assistive device   Problem: RH Bed Mobility Goal: LTG Patient will perform bed mobility with assist (PT) Description: LTG: Patient will perform bed mobility with assistance, with/without cues (PT). Flowsheets (Taken 04/30/2021 1405) LTG: Pt will perform bed mobility with assistance level of: Independent with assistive device    Problem: RH Bed to Chair Transfers Goal: LTG Patient will perform bed/chair transfers w/assist (PT) Description: LTG: Patient will perform bed to chair transfers with assistance (PT). Flowsheets (Taken 04/30/2021 0405) LTG: Pt will perform Bed to Chair Transfers with assistance level: Supervision/Verbal cueing   Problem: RH Car Transfers Goal: LTG Patient will perform car transfers with assist (PT) Description: LTG: Patient will perform car transfers with assistance (PT). Flowsheets (Taken 04/30/2021 1405) LTG: Pt will perform car transfers with assist:: Supervision/Verbal cueing   Problem: RH Furniture Transfers Goal: LTG Patient will perform furniture transfers w/assist (OT/PT) Description: LTG: Patient will perform furniture transfers  with assistance (OT/PT). Flowsheets (Taken 04/30/2021 1405) LTG: Pt will perform furniture transfers with assist:: Supervision/Verbal cueing   Problem: RH  Ambulation Goal: LTG Patient will ambulate in controlled environment (PT) Description: LTG: Patient will ambulate in a controlled environment, # of feet with assistance (PT). Flowsheets (Taken 04/30/2021 1405) LTG: Pt will ambulate in controlled environ  assist needed:: Supervision/Verbal cueing LTG: Ambulation distance in controlled environment: 100 ft using LRAD Goal: LTG Patient will ambulate in home environment (PT) Description: LTG: Patient will ambulate in home environment, # of feet with assistance (PT). Flowsheets (Taken 04/30/2021 1405) LTG: Pt will ambulate in home environ  assist needed:: Supervision/Verbal cueing LTG: Ambulation distance in home environment: at least 50 ft with LRAD   Problem: RH Stairs Goal: LTG Patient will ambulate up and down stairs w/assist (PT) Description: LTG: Patient will ambulate up and down # of stairs with assistance (PT) Flowsheets (Taken 04/30/2021 1405) LTG: Pt will  ambulate up and down number of stairs: at least one step with HR setup as perf home environment

## 2021-05-02 NOTE — Progress Notes (Signed)
PROGRESS NOTE   Subjective/Complaints:  Pt reports no SOB, less coughing.  Per staff, has raw/red skin in groin and on buttocks from previous loose stools.  Pt's main complaint is being tired- slept OK.    ROS:  Pt denies SOB, abd pain, CP, N/V/C/D, and vision changes  Objective:   No results found. Recent Labs    05/02/21 0619  WBC 6.5  HGB 10.2*  HCT 32.4*  PLT 359   Recent Labs    05/02/21 0619  NA 138  K 4.6  CL 104  CO2 31  GLUCOSE 92  BUN 14  CREATININE 0.59  CALCIUM 8.1*    Intake/Output Summary (Last 24 hours) at 05/02/2021 1104 Last data filed at 05/02/2021 0845 Gross per 24 hour  Intake 820 ml  Output --  Net 820 ml     Pressure Injury 04/29/21 Buttocks Left Stage 2 -  Partial thickness loss of dermis presenting as a shallow open injury with a red, pink wound bed without slough. healing s1 blanchable as of 12/30 (Active)  04/29/21 1540  Location: Buttocks  Location Orientation: Left  Staging: Stage 2 -  Partial thickness loss of dermis presenting as a shallow open injury with a red, pink wound bed without slough.  Wound Description (Comments): healing s1 blanchable as of 12/30  Present on Admission: Yes     Pressure Injury 04/29/21 Buttocks Right Stage 2 -  Partial thickness loss of dermis presenting as a shallow open injury with a red, pink wound bed without slough. healing s2, blanchable as of 12/30 (Active)  04/29/21 1541  Location: Buttocks  Location Orientation: Right  Staging: Stage 2 -  Partial thickness loss of dermis presenting as a shallow open injury with a red, pink wound bed without slough.  Wound Description (Comments): healing s2, blanchable as of 12/30  Present on Admission: Yes    Physical Exam: Vital Signs Blood pressure (!) 141/52, pulse 76, temperature 98.9 F (37.2 C), resp. rate 20, height 5' (1.524 m), weight 75 kg, SpO2 95 %.    General: awake, alert,  appropriate, sleepy; laying supine in bed; wearing 2L O2 by [place]; NT in room; NAD HENT: conjugate gaze; oropharynx moist CV: regular rate; no JVD Pulmonary:- wheezed slightly- decreased at bases B/L- otherwise good GI: soft, NT, ND, (+)BS Psychiatric: appropriate but very flat Neurological: alert  Musculoskeletal:     Right lower leg: Edema present.     Left lower leg: Edema present.  Skin:    General: Skin is warm. Groin very red/raw appearing B/L- but hard ot assess due to a lot of cream in place- buttocks similar- looks like MASD    Comments: Chronic changes in LE's, mild.   Neurological:     Comments: Patient is alert.  Impaired reasoning. Sitting up in chair.  Oriented x3 and follows commands. Some delays in processing. Speech dysarthric/low volume. Able to provide biographical information. UE Motor 4/5. LE: 3/5 prox to 4/5 distally. No focal sensory abnl. No limb ataxia  Psychiatric:     Comments: Flat, slow to engage but cooperative.    Assessment/Plan: 1. Functional deficits which require 3+ hours per day of interdisciplinary therapy  in a comprehensive inpatient rehab setting. Physiatrist is providing close team supervision and 24 hour management of active medical problems listed below. Physiatrist and rehab team continue to assess barriers to discharge/monitor patient progress toward functional and medical goals  Care Tool:  Bathing    Body parts bathed by patient: Right arm, Left arm, Chest, Abdomen, Right upper leg, Left upper leg, Right lower leg, Left lower leg, Face         Bathing assist Assist Level: Supervision/Verbal cueing     Upper Body Dressing/Undressing Upper body dressing   What is the patient wearing?: Dress    Upper body assist Assist Level: Supervision/Verbal cueing    Lower Body Dressing/Undressing Lower body dressing      What is the patient wearing?: Pants, Incontinence brief     Lower body assist Assist for lower body dressing: Moderate  Assistance - Patient 50 - 74%     Toileting Toileting    Toileting assist Assist for toileting: Maximal Assistance - Patient 25 - 49%     Transfers Chair/bed transfer  Transfers assist     Chair/bed transfer assist level: Moderate Assistance - Patient 50 - 74%     Locomotion Ambulation   Ambulation assist      Assist level: Minimal Assistance - Patient > 75% Assistive device: Walker-rolling Max distance: 5 feet   Walk 10 feet activity   Assist  Walk 10 feet activity did not occur: Safety/medical concerns        Walk 50 feet activity   Assist Walk 50 feet with 2 turns activity did not occur: Safety/medical concerns         Walk 150 feet activity   Assist Walk 150 feet activity did not occur: Safety/medical concerns         Walk 10 feet on uneven surface  activity   Assist Walk 10 feet on uneven surfaces activity did not occur: Safety/medical concerns         Wheelchair     Assist Is the patient using a wheelchair?: Yes Type of Wheelchair: Manual Wheelchair activity did not occur: Safety/medical concerns         Wheelchair 50 feet with 2 turns activity    Assist    Wheelchair 50 feet with 2 turns activity did not occur: Safety/medical concerns       Wheelchair 150 feet activity     Assist  Wheelchair 150 feet activity did not occur: Safety/medical concerns       Blood pressure (!) 141/52, pulse 76, temperature 98.9 F (37.2 C), resp. rate 20, height 5' (1.524 m), weight 75 kg, SpO2 95 %.  Medical Problem List and Plan: 1. Functional deficits. debility secondary to pneumonia/acute respiratory failure.  Pt also with resolving metabolic encephalopathy             -patient may shower             -ELOS/Goals: 7-10 days/ mod I for basic mobility and self-care  Con't CIR- PT and OT 2.  Impaired mobility: Continue Lovenox             -antiplatelet therapy: Aspirin 81 mg daily 3. Pain Management: Tylenol as needed 4.  Mood: con't emotional support             -antipsychotic agents: N/A 5. Neuropsych: This patient is capable of making decisions on her own behalf. 6. Skin/Wound Care: Routine skin checks 7. Fluids/Electrolytes/Nutrition: encourage PO             -  pt is edentulous. I asked her if she would like chopped food and she said yes--->ordered D3 diet to start 8.  SVT.  Patient did initially require IV adenosine successful cardioversion.  Continue Lopressor 50 mg twice daily. Cardiology services follow-up             -monitor HR with activities 9.  Constipation.  Continue MiraLAX 10. Pneumonia: complete course of tamiflu and augmentin             -wean from oxygen as able             -IS, OOB  Start robitussin DM q4H prn.   1/2- wean O2 as tolerated 11. Cognitive impairment: placed nursing order to request assistance with meals and meals out of bed in chair 12. Diarrhea: start imodium prn  1/2- will stop Miralax since having loose stools. Con't Imodium prn 13. MASD in groin/buttocks  1/2- will try Nystatin cream- but might need Diflucan?    LOS: 3 days A FACE TO FACE EVALUATION WAS PERFORMED  Shawniece Oyola 05/02/2021, 11:04 AM

## 2021-05-02 NOTE — IPOC Note (Signed)
Overall Plan of Care [facility]) Patient Details Name: JUNG INGERSON MRN: [id number] DOB: March 15, 1941  Admitting Diagnosis: Debility  Hospital Problems: Principal Problem:   Debility     Functional Problem List: Nursing Bladder, Bowel, Endurance, Medication Management, Safety, Skin Integrity  PT Balance, Behavior, Edema, Endurance, Motor, Perception, Safety, Sensory  OT Balance, Skin Integrity, Vision, Cognition, Endurance, Safety  SLP Cognition, Perception  TR         Basic ADLs: OT Eating, Grooming, Bathing, Dressing, Toileting     Advanced  ADLs: OT Simple Meal Preparation     Transfers: PT Bed Mobility, Bed to Chair, Car, Occupational psychologist, Research scientist (life sciences): PT Ambulation, Psychologist, prison and probation services, Stairs     Additional Impairments: OT None  SLP Social Cognition   Memory, Attention, Awareness, Problem Solving  TR      Anticipated Outcomes Item Anticipated Outcome  Self Feeding Supervision  Swallowing  N/A   Basic self-care  Marketing executive Transfers Supervision  Bowel/Bladder  Mod I  Transfers  Mod I/ supervision  Locomotion  Supervision  Communication  N/A  Cognition  supervision basic cognition  Pain  n/a  Safety/Judgment  Mod I and no falls   Therapy Plan: PT Intensity: Minimum of 1-2 x/day ,45 to 90 minutes PT Frequency: 5 out of 7 days PT Duration Estimated Length of Stay: 10-12 days OT Intensity: Minimum of 1-2 x/day, 45 to 90 minutes OT Frequency: 5 out of 7 days OT Duration/Estimated Length of Stay: 10-12 days SLP Intensity: Minumum of 1-2 x/day, 30 to 90 minutes SLP Frequency: 3 to 5 out of 7 days SLP Duration/Estimated Length of Stay: 7-10 days   Due to the current state of emergency, patients may not be receiving their 3-hours of Medicare-mandated therapy.   Team Interventions: Nursing Interventions Patient/Family Education, Bladder Management, Bowel Management, Disease  Management/Prevention, Medication Management, Skin Care/Wound Management, Discharge Planning  PT interventions Ambulation/gait training, Balance/vestibular training, Cognitive remediation/compensation, Disease management/prevention, Discharge planning, Community reintegration, DME/adaptive equipment instruction, Functional mobility training, Patient/family education, Neuromuscular re-education, Psychosocial support, Stair training, UE/LE Strength taining/ROM, Wheelchair propulsion/positioning, UE/LE Coordination activities, Therapeutic Activities, Skin care/wound management, Therapeutic Exercise, Visual/perceptual remediation/compensation  OT Interventions Balance/vestibular training, Disease mangement/prevention, Self Care/advanced ADL retraining, Therapeutic Exercise, UE/LE Strength taining/ROM, Skin care/wound managment, Pain management, DME/adaptive equipment instruction, Cognitive remediation/compensation, Community reintegration, Equities trader education, UE/LE Coordination activities, Therapeutic Activities, Visual/perceptual remediation/compensation, Psychosocial support, Functional mobility training, Discharge planning  SLP Interventions Cognitive remediation/compensation, Internal/external aids, Medication managment, Cueing hierarchy, Patient/family education, Functional tasks  TR Interventions    SW/CM Interventions Discharge Planning, Psychosocial Support, Patient/Family Education   Barriers to Discharge MD  Medical stability, Home enviroment access/loayout, Incontinence, Lack of/limited family support, Weight, Medication compliance, and New oxygen  Nursing Decreased caregiver support, Incontinence, Wound Care, Lack of/limited family support, Weight, Medication compliance, New oxygen Lives in 1 level home with level entry. Son can provide min assist at discharge.  PT Decreased caregiver support, Home environment access/layout, Incontinence, Insurance for SNF coverage, Weight, Medication  compliance, New oxygen, Other (comments)    OT Decreased caregiver support, Lack of/limited family support, Inaccessible home environment, Incontinence, Wound Care ?walker accessible bathroom  SLP Decreased caregiver support lives with son, but he does work  SW       Team Discharge Planning: Destination: PT-Home ,OT- Home , SLP-Home Projected Follow-up: PT-Home health PT, OT-  Home health OT, SLP-24 hour supervision/assistance, None Projected Equipment Needs: PT-To be determined, OT- To be  determined, SLP-None recommended by SLP Equipment Details: PT- , OT-  Patient/family involved in discharge planning: PT- Patient,  OT-Patient, SLP-Patient  MD ELOS: 7-10 days  Medical Rehab Prognosis:  Good Assessment: Pt is an 81 yr old female with flu/pneumonia, acute resp failure with new O2- and SVT s/p adenosine.  Has significant MASD in groin- starting treatment- and baseline cognitive impairment and having new diarrhea-   Goals- supervision to mod I    See Team Conference Notes for weekly updates to the plan of care

## 2021-05-02 NOTE — Care Management (Deleted)
[facility] Individual Statement of Services  Patient Name:  Brooke Harrington  Date:  05/02/2021  Welcome to the [facility].  Our goal is to provide you with an individualized program based on your diagnosis and situation, designed to meet your specific needs.  With this comprehensive rehabilitation program, you will be expected to participate in at least 3 hours of rehabilitation therapies Monday-Friday, with modified therapy programming on the weekends.  Your rehabilitation program will include the following services:  Physical Therapy (PT), Occupational Therapy (OT), Speech Therapy (ST), 24 hour per day rehabilitation nursing, Therapeutic Recreaction (TR), Psychology, Neuropsychology, Care Coordinator, Rehabilitation Medicine, [place], and Other  Weekly team conferences will be held on Tuesdays to discuss your progress.  Your Inpatient Rehabilitation Care Coordinator will talk with you frequently to get your input and to update you on team discussions.  Team conferences with you and your family in attendance may also be held.  Expected length of stay: 7-12 days    Overall anticipated outcome: Supervision  Depending on your progress and recovery, your program may change. Your Inpatient Rehabilitation Care Coordinator will coordinate services and will keep you informed of any changes. Your Inpatient Rehabilitation Care Coordinator's name and contact numbers are listed  below.  The following services may also be recommended but are not provided by the [facility] will be made to provide these services after discharge if needed.  Arrangements include referral to agencies that provide these services.  Your insurance has been verified to be:  Medicare A/B  Your primary  doctor is:  No PCP listed  Pertinent information will be shared with your doctor and your insurance company.  Inpatient Rehabilitation Care Coordinator:  Cathleen Corti [phone] or [phone]  Information discussed with and copy given to patient by: Rana Snare, 05/02/2021, 11:56 AM

## 2021-05-02 NOTE — Progress Notes (Signed)
Occupational Therapy Session Note  Patient Details  Name: Brooke Harrington MRN: [id number] Date of Birth: May 15, 1940  Today's Date: 05/02/2021 OT Individual Time: [id number] & [id number] OT Individual Time Calculation (min): 55 min & 41 min   Short Term Goals: Week 1:  OT Short Term Goal 1 (Week 1): Pt will complete LB dressing with Min A using adaptive strategies OT Short Term Goal 2 (Week 1): Pt will standing at the sink for ~2 minutes while engaging in grooming tasks to increase standing endurance OT Short Term Goal 3 (Week 1): Pt will complete 1/3 components of toileting with no more than Min balance assistance while maintaining stable 02 sats  Skilled Therapeutic Interventions/Progress Updates:  Session 1 Skilled OT intervention completed with focus on education about OT purpose, rehab goals, POC and ADL retraining. Pt received seated EOB, with NT and RN in room assisting pt with meds/toileting. Pt agreeable to session. Pt with [place] on 2L O2, with 95% sat, and remained throughout rest of session. Note- pt needed increased time for task completion due to pt not self-initiating tasks and highly dependent on verbal cues for continuity. Pt completed EOB bathing with supervision, and able to reach BLEs, demonstrating improvement from OT eval. Pt opting to donn night gown only, with supervision assist, and min A for [place] cord only. Pt completed grooming while EOB with supervision. Pt demonstrating impaired vision throughout session, often requiring verbal cues for finding items and education on low vision strategies provided. Donned TEDS with total A per MD order. Pt sit > stand using RW with CGA, then short ambulatory transfer to recliner with min A. Pt bumping into door and chair on the way to recliner, reporting she couldn't see it at all, demonstrating impaired vision in unsafe context with functional mobility. Vision to be assessed in greater detail in PM session. Pt left seated in recliner, with chair  alarm on, pt re-set up with breakfast, and pt educated on tactile location of call button with call bell at pt's side and all other needs in reach at end of session.  Session 2 Skilled OT intervention completed with focus on assessing pt's vision, functional transfers and toileting tasks. Pt received seated EOB, finishing lunch, with pt agreeable to session. Pt wearing [place] with 1L O2, with SPO2 at 98% sat, and remained on throughout session. OT eval reported need of assessing pt's vision in further detail, with therapist noting the following per pt report and impairments observed: pt with blurry vision, unable to read x-large print on cafe menu with glasses on, reporting only being able to see outlines of shapes and therapist's head. Pt is impaired with motor planning, coordination of BUE with max difficulty completing finger/thumb opposition, and finger to nose test with heavy overshooting when in pt's peripheral. Presumably pt relying on outline of therapist hand to find finger during assessment. Pt able to track vertically and horizontally but with decreased smoothness, increased time needed, with impairment in saccadic movements as well. Pt sit > stand using RW with education provided on technique, for pushing up to sit with one hand and one hand on RW with pt requiring min A. While in stance, pt expressing the urgent need to void stating "I'm already going", stand pivot using RW with mod multimodal cues to pivot to [facility] in room for sequencing of transfer. Doffed clothing with total A for urgency, with incontinent episode void, with pt able to finish in commode. Pt with small BM present in commode but reported  not knowing she had gone. Pt sit > stand using RW from [facility] with min A then able to complete posterior pericare with CGA for balance and min A for donning pants over hips. Pt stand pivot at same assist to EOB, and able to side step for sitting further up in bed with mod tactile cues. Pt completed bed  mobility with min A for lifting BLEs, and min A for repositioning in bed. Pt left in long sitting, in bed, with bed alarm on and all needs in reach at end of session.   Therapy Documentation Precautions:  Precautions Precautions: Fall Precaution Comments: 2L O2 via [place], droplet precautions, impaired vision due to cataracts Restrictions Weight Bearing Restrictions: No  Pain: No c/o pain   Therapy/Group: Individual Therapy  Meygan Kyser E Tynleigh Birt 05/02/2021, 7:28 AM

## 2021-05-02 NOTE — Progress Notes (Signed)
Speech Language Pathology Daily Session Note  Patient Details  Name: Brooke Harrington MRN: [id number] Date of Birth: 1940/07/30  Today's Date: 05/02/2021 SLP Individual Time: 1130-1200 SLP Individual Time Calculation (min): 30 min  Short Term Goals: Week 1: SLP Short Term Goal 1 (Week 1): Patient will demonstrate adequate problem solving when completing functional tasks/ADL's with minA verbal and visual cues. SLP Short Term Goal 2 (Week 1): Patient will be able to recall therapeutic interventions, strategies/recommendations with minA verbal cues. SLP Short Term Goal 3 (Week 1): Patient will demonstrate adequate level of awareness to errors and at least attempt to correct during functional tasks and with minA verbal and demonstration cues. SLP Short Term Goal 4 (Week 1): Patient will initiate to make requests for help/assistance and ask appropriate questions during completion of functional tasks, with minA verbal cues.  Skilled Therapeutic Interventions: Pt seen for skilled ST with focus on cognitive goals, pt in bed and agreeable to skilled ST. Pt states she feels as though she is at cognitive baseline, has 24/7 supervision at home from grandson. Pt oriented to DOW, MOY, year and birthday, disoriented to age repeating she was 53. SLP facilitating med management task by providing mod A cues to understand and recall current medication regime (didn't take meds at home). SLP educating pt on recommendations for supervision/assist with med management 2' cognitive impairments and visual impairments, pt agreeable. Pt left in bed with alarm set and all needs within reach. Cont ST POC.  Pain Pain Assessment Pain Scale: 0-10 Pain Score: 0-No pain  Therapy/Group: Individual Therapy  Tacey Ruiz 05/02/2021, 11:57 AM

## 2021-05-02 NOTE — Progress Notes (Signed)
Inpatient Rehabilitation Care Coordinator Assessment and Plan Patient Details  Name: Brooke Harrington MRN: [id number] Date of Birth: 30-Nov-1940  Today's Date: 05/02/2021  Hospital Problems: Principal Problem:   Debility  Past Medical History:  Past Medical History:  Diagnosis Date   Hypertension    Past Surgical History:  Past Surgical History:  Procedure Laterality Date   HAND SURGERY     Social History:  reports that she has never smoked. She has never used smokeless tobacco. She reports current alcohol use. She reports that she does not use drugs.  Family / Support Systems Marital Status: Divorced How Long?: 40+ years Patient Roles: Partner Spouse/Significant Other: Divorced Children: 3 adult children- Multimedia programmer (lives in [place] and pt stays with him and his wife Brooke Harrington), Brooke Harrington (lives in [place]), Brooke Harrington (lives in [place]) Other Supports: DIL Brooke Harrington Anticipated Caregiver: Brooke Harrington and his wife Brooke Harrington Ability/Limitations of Caregiver: Pt son and wife work 9am-5pm at the [employer] and unable to provide care during the day. Caregiver Availability: Intermittent Family Dynamics: Pt lives with her son and DIL  Social History Preferred language: English Religion: Non-Denominational Cultural Background: Pt worked at [employer] for 35 years Education: some Medical sales representative - How often do you need to have someone help you when you read instructions, pamphlets, or other written material from your doctor or pharmacy?: Never Writes: Yes Employment Status: Retired Date Retired/Disabled/Unemployed: 2009 Public relations account executive Issues: Denies Guardian/Conservator: N/A   Abuse/Neglect Abuse/Neglect Assessment Can Be Completed: Yes Physical Abuse: Denies Verbal Abuse: Denies Sexual Abuse: Denies Exploitation of patient/patient's resources: Denies Self-Neglect: Denies  Patient response to: Social Isolation - How often do you feel lonely or isolated from those around  you?: Never  Emotional Status Pt's affect, behavior and adjustment status: Pt in good spirits at time of visit. Recent Psychosocial Issues: Pt admits to having anxiety as she worries about her children. Psychiatric History: Denies Substance Abuse History: Pt admits to an occassional glass of wine.  Patient / Family Perceptions, Expectations & Goals Pt/Family understanding of illness & functional limitations: Pt and family have a general understanding of pt care needs Premorbid pt/family roles/activities: Indepent Anticipated changes in roles/activities/participation: Assistance with ADLs/IADLs Pt/family expectations/goals: Pt goal is to work on [employer] and sanity."  [employer]: None Premorbid Home Care/DME Agencies: None Transportation available at discharge: TBD Is the patient able to respond to transportation needs?: Yes (Son Brooke Harrington reports all siblings help split getting pt to appointments.) In the past 12 months, has lack of transportation kept you from medical appointments or from getting medications?: No In the past 12 months, has lack of transportation kept you from meetings, work, or from getting things needed for daily living?: No Resource referrals recommended: Neuropsychology  Discharge Planning Living Arrangements: Children, Other relatives Support Systems: Children, Other relatives Type of Residence: Private residence Insurance Resources: [employer] Resources: Radio broadcast assistant Screen Referred: No Living Expenses: Lives with family Money Management: Family Does the patient have any problems obtaining your medications?: No Home Management: Pt reports that her son and DIL manage meals and house cleaning. Patient/Family Preliminary Plans: No changes Care Coordinator Barriers to Discharge: Decreased caregiver support, Lack of/limited family support Care Coordinator Barriers to Discharge Comments: Pt son and DIL work full  time. Care Coordinator Anticipated Follow Up Needs: HH/OP Expected length of stay: 7-12 days  Clinical Impression SW met with pt in room to introduce self, explain role, and discuss discharge process. Pt is not a English as a second language teacher. No  HCPOA. No DME. Pt is new to oxygen and will need to determine if this is needed at discharge. Pt aware SW spoke with her son Brooke Harrington and primary SW Brooke Harrington will follow-up with updates after team conference.   32- SW spoke with pt son Brooke Harrington to introduce self, explain role, and discuss discharge process. He reports they will need guidance on how to provide care to his mother since he and his wife work full time. He is aware primary SW will f/u to provide updates and provide insight on d/c needs.   Madyx Delfin A Manpreet Strey 05/02/2021, 11:14 PM

## 2021-05-02 NOTE — Progress Notes (Signed)
°   05/02/21 1300  Clinical Encounter Type  Visited With Patient  Visit Type Initial;Social support;Spiritual support  Referral From Nurse  Consult/Referral To Chaplain   Chaplain visited pt as requested by spiritual consult.  Pt could not remember asking for a Chaplain but appreciated the visit.  Pt shared she had the flu and felt she was doing better.  Pt shared that her three children lived in the [place], [place] area.  Pt requested prayer and Chaplain prayed with pt.  Chaplain remains available to pt.  Andi Hence, Chaplain  Pager: [phone]

## 2021-05-03 DIAGNOSIS — R5381 Other malaise: Secondary | ICD-10-CM | POA: Diagnosis not present

## 2021-05-03 MED ORDER — MAGNESIUM GLUCONATE 500 MG PO TABS
250.0000 mg | ORAL_TABLET | Freq: Every day | ORAL | Status: DC
Start: 2021-05-03 — End: 2021-05-03

## 2021-05-03 NOTE — Progress Notes (Signed)
PROGRESS NOTE   Subjective/Complaints: Feeling better Asks if she is doing well Trying to spend more time in the chair rather than in bed Feels her shortness of breath and cough have improved.    ROS:  Pt denies SOB, abd pain, CP, N/V/C/D, and vision changes  Objective:   No results found. Recent Labs    05/02/21 0619  WBC 6.5  HGB 10.2*  HCT 32.4*  PLT 359   Recent Labs    05/02/21 0619  NA 138  K 4.6  CL 104  CO2 31  GLUCOSE 92  BUN 14  CREATININE 0.59  CALCIUM 8.1*    Intake/Output Summary (Last 24 hours) at 05/03/2021 1148 Last data filed at 05/03/2021 0740 Gross per 24 hour  Intake 680 ml  Output --  Net 680 ml     Pressure Injury 04/29/21 Buttocks Left Stage 2 -  Partial thickness loss of dermis presenting as a shallow open injury with a red, pink wound bed without slough. healing s1 blanchable as of 12/30 (Active)  04/29/21 1540  Location: Buttocks  Location Orientation: Left  Staging: Stage 2 -  Partial thickness loss of dermis presenting as a shallow open injury with a red, pink wound bed without slough.  Wound Description (Comments): healing s1 blanchable as of 12/30  Present on Admission: Yes     Pressure Injury 04/29/21 Buttocks Right Stage 2 -  Partial thickness loss of dermis presenting as a shallow open injury with a red, pink wound bed without slough. healing s2, blanchable as of 12/30 (Active)  04/29/21 1541  Location: Buttocks  Location Orientation: Right  Staging: Stage 2 -  Partial thickness loss of dermis presenting as a shallow open injury with a red, pink wound bed without slough.  Wound Description (Comments): healing s2, blanchable as of 12/30  Present on Admission: Yes    Physical Exam: Vital Signs Blood pressure (!) 143/67, pulse 77, temperature 98 F (36.7 C), resp. rate 18, height 5' (1.524 m), weight 75 kg, SpO2 97 %.    General: awake, alert, appropriate, sleepy;  laying supine in bed; wearing 2L O2 by [place]; NT in room; NAD, BMI 32.29 HENT: conjugate gaze; oropharynx moist CV: regular rate; no JVD Pulmonary:- wheezed slightly- decreased at bases B/L- otherwise good GI: soft, NT, ND, (+)BS Psychiatric: appropriate but very flat Neurological: alert  Musculoskeletal:     Right lower leg: Edema present.     Left lower leg: Edema present.  Skin:    General: Skin is warm. Groin very red/raw appearing B/L- but hard ot assess due to a lot of cream in place- buttocks similar- looks like MASD    Comments: Chronic changes in LE's, mild.   Neurological:     Comments: Patient is alert.  Impaired reasoning. Sitting up in chair.  Oriented x3 and follows commands. Some delays in processing. Speech dysarthric/low volume. Able to provide biographical information. UE Motor 4/5. LE: 3/5 prox to 4/5 distally. No focal sensory abnl. No limb ataxia  Psychiatric:     Comments: Flat, slow to engage but cooperative.    Assessment/Plan: 1. Functional deficits which require 3+ hours per day of interdisciplinary therapy  in a comprehensive inpatient rehab setting. Physiatrist is providing close team supervision and 24 hour management of active medical problems listed below. Physiatrist and rehab team continue to assess barriers to discharge/monitor patient progress toward functional and medical goals  Care Tool:  Bathing    Body parts bathed by patient: Right arm, Left arm, Chest, Abdomen, Right upper leg, Left upper leg, Right lower leg, Left lower leg, Face         Bathing assist Assist Level: Supervision/Verbal cueing     Upper Body Dressing/Undressing Upper body dressing   What is the patient wearing?: Dress    Upper body assist Assist Level: Supervision/Verbal cueing    Lower Body Dressing/Undressing Lower body dressing      What is the patient wearing?: Pants, Incontinence brief     Lower body assist Assist for lower body dressing: Moderate Assistance  - Patient 50 - 74%     Toileting Toileting    Toileting assist Assist for toileting: Maximal Assistance - Patient 25 - 49%     Transfers Chair/bed transfer  Transfers assist     Chair/bed transfer assist level: Contact Guard/Touching assist     Locomotion Ambulation   Ambulation assist      Assist level: Contact Guard/Touching assist Assistive device: Walker-rolling Max distance: 18'   Walk 10 feet activity   Assist  Walk 10 feet activity did not occur: Safety/medical concerns  Assist level: Contact Guard/Touching assist Assistive device: Walker-rolling   Walk 50 feet activity   Assist Walk 50 feet with 2 turns activity did not occur: Safety/medical concerns         Walk 150 feet activity   Assist Walk 150 feet activity did not occur: Safety/medical concerns         Walk 10 feet on uneven surface  activity   Assist Walk 10 feet on uneven surfaces activity did not occur: Safety/medical concerns         Wheelchair     Assist Is the patient using a wheelchair?: Yes Type of Wheelchair: Manual Wheelchair activity did not occur: Safety/medical concerns         Wheelchair 50 feet with 2 turns activity    Assist    Wheelchair 50 feet with 2 turns activity did not occur: Safety/medical concerns       Wheelchair 150 feet activity     Assist  Wheelchair 150 feet activity did not occur: Safety/medical concerns       Blood pressure (!) 143/67, pulse 77, temperature 98 F (36.7 C), resp. rate 18, height 5' (1.524 m), weight 75 kg, SpO2 97 %.  Medical Problem List and Plan: 1. Functional deficits. debility secondary to pneumonia/acute respiratory failure.  Pt also with resolving metabolic encephalopathy             -patient may shower             -ELOS/Goals: 7-10 days/ mod I for basic mobility and self-care  Con't CIR- PT and OT 2.  Impaired mobility: Continue Lovenox             -antiplatelet therapy: Aspirin 81 mg  daily 3. Pain Management: Tylenol as needed 4. Mood: con't emotional support             -antipsychotic agents: N/A 5. Neuropsych: This patient is capable of making decisions on her own behalf. 6. Skin/Wound Care: Routine skin checks 7. Fluids/Electrolytes/Nutrition: encourage PO             -pt  is edentulous. I asked her if she would like chopped food and she said yes--->ordered D3 diet to start 8.  SVT.  Patient did initially require IV adenosine successful cardioversion.  Continue Lopressor 50 mg twice daily. Cardiology services follow-up             -monitor HR with activities 9.  Obesity BMI 32.29: provide dietary education 10. Pneumonia: complete course of tamiflu and augmentin             -wean from oxygen as able             -IS, OOB  Start robitussin DM q4H prn.   1/2- wean O2 as tolerated 11. Cognitive impairment: placed nursing order to request assistance with meals and meals out of bed in chair 12. Diarrhea: continue imodium.  13. MASD in groin/buttocks: continue nystatin cream.  14. Hypertension: creatinine stable. Continue lopressor.      LOS: 4 days A FACE TO FACE EVALUATION WAS PERFORMED  Drema Pry Catarina Huntley 05/03/2021, 11:48 AM

## 2021-05-03 NOTE — Progress Notes (Signed)
Successfully weaned pt to room air. Pt satting 94-100% after activity and after rest. No complications noted. Pt denies being SOB. Pt continues to cough  Mylo Red, LPN

## 2021-05-03 NOTE — Progress Notes (Signed)
Occupational Therapy Session Note  Patient Details  Name: Brooke Harrington MRN: [id number] Date of Birth: Sep 23, 1940  Today's Date: 05/03/2021 OT Individual Time: 7209-4709 & 6283-6629 OT Individual Time Calculation (min): 53 min & 28 min   Short Term Goals: Week 1:  OT Short Term Goal 1 (Week 1): Pt will complete LB dressing with Min A using adaptive strategies OT Short Term Goal 2 (Week 1): Pt will standing at the sink for ~2 minutes while engaging in grooming tasks to increase standing endurance OT Short Term Goal 3 (Week 1): Pt will complete 1/3 components of toileting with no more than Min balance assistance while maintaining stable 02 sats  Skilled Therapeutic Interventions/Progress Updates:  Session 1 Skilled OT intervention completed with focus on dynamic balance, low vision strategy education, and overall endurance. Pt received seated in recliner, agreeable to session. Pt with [place] on providing 1L O2, with 97% sat, and remained on 1L throughout entire session. Pt required initiation and re-directive cues throughout session for task completion. Pt sit > stand using RW with CGA, then short ambulatory transfer with CGA about 8 ft to w/c in room, with pt not bumping into any items, but verbally stating "I don't know if I'm going to bump into anything." Pt transported with total A in w/c for energy conservation to therapy gym. Pt participated in clip activity to promote increased dynamic balance and endurance needed for self-care tasks. Pt initially with mod difficulty locating vertical pole to place clips > downgraded activity to having pt place on horizontal bars. Pt with continued mod difficulty find where to place clips, with education provided on low vision compensatory method of using L hand to find bar, then use R hand to meet L for placement of clip. Pt initially with poor motor planning and command following with attempt to just transfer clip from one hand to other, with hand over hand cue  provided with pt able to complete rest of clips with CGA for balance only using RW. Throughout task, pt required several seated rest breaks due to fatigue, with pt tolerating about 2 mins in standing per round. Education provided to pt on pursed lip breathing vs mouth breathing and importance of increasing endurance for need with functional tasks and demands. Therapist applied hospital tape to call button to increase pt's independence with locating button for help PRN and educated pt on low vision method. Pt left seated in recliner, with chair alarm on and all needs in reach at end of session.   Session 2 Skilled OT intervention completed with focus on pt and pt family education regarding POC, rehab goals, recommendations for d/c assist level and DME. Pt received seated in recliner, finishing lunch, with son in room. Pt's son with questions about pt's CLOF, with extensive conversation on therapist recommendations. Educated pt and family on need for [facility] and TTB for use of self-care tasks at home, with therapist showing pt's son on website how to purchase online if interested in getting on their own. Pt's son reporting that him and his wife work all day, and will "have to leave her alone" during the day, with therapist advising against, due to safety concern with visual/cognitive impairment, and assist needed for transfers. Home management strategies discussed with pt and family, with therapist advising they contact CSW to discuss insurance-related questions. Pt left seated in recliner, with son in room, chair alarm on and all immediate needs in reach at end of session.   Therapy Documentation Precautions:  Precautions  Precautions: Fall Precaution Comments: 2L O2 via [place], droplet precautions, impaired vision due to cataracts Restrictions Weight Bearing Restrictions: No  Pain: No c/o pain   Therapy/Group: Individual Therapy  Brooke Harrington Brooke Harrington 05/03/2021, 8:00 AM

## 2021-05-03 NOTE — Progress Notes (Signed)
Physical Therapy Session Note  Patient Details  Name: Brooke Harrington MRN: [id number] Date of Birth: [birth year]  Today's Date: 05/03/2021 PT Individual Time: 0802-0859 PT Individual Time Calculation (min): 57 min   Short Term Goals: Week 1:  PT Short Term Goal 1 (Week 1): Pt will demonstrate consistent CGA with performance of bed mobility. PT Short Term Goal 2 (Week 1): Pt will perform all functional standing transfers at [facility]. PT Short Term Goal 3 (Week 1): Pt will initiate curb step training. PT Short Term Goal 4 (Week 1): Pt will ambulate 25' consistently with CGA and w/c follow for fatigue.  Skilled Therapeutic Interventions/Progress Updates:  Pt received supine in bed, denied pain and was agreeable to whatever you want me to do. Pt on 1L O2, noted nasal cannula donned incorrectly, SpO2 at 97% and fixed cannula position. Emphasis of session on improved activity tolerance and gait training. Donned ted hose w/total A and pt performed supine <>sit w/S*, verbal cues for hand placement due to suspected visual impairment. Threaded BLEs through pants and donned slippers at EOB w/total A. Sit <>stand from EOB to RW w/CGA and finished lower body dressing w/total A. Stand pivot to [facility] w/RW and CGA. Transferred pt to 1L portable O2 and pt transferred to 5N hallway for gait training w/total A for time management.   Gait training:  -Pt performed sit <>stands throughout session w/CGA, min verbal cues for hand placement. Noted no recall of hand placement with each repetition.  -Pt ambulated 17', 1' and 0.5' w/RW and CGA. Noted shuffling gait, narrow BOS and decreased step length bilaterally. Pt reported being too weak and fatigued to walk more despite max encouragement.   Pt performed 5xSTS in 31s w/BUE support. Discussed purpose and results interpretation of assessment w/pt who verbalized understanding. Pt seemingly sad today, reported she  has difficulty sleeping due to constant thoughts of being a  burden and being a fat old lady. Provided emotional support and encouragement regarding pt's function and potential, pt verbalized agreement.   Pt transported back to room w/total A 2/2 fatigue and performed sit <>stand pivot from WC to recliner w/RW and CGA. Pt was left seated in recliner in room, all needs in reach.   Therapy Documentation Precautions:  Precautions Precautions: Fall Precaution Comments: 2L O2 via [place], droplet precautions, impaired vision due to cataracts Restrictions Weight Bearing Restrictions: No   Therapy/Group: Individual Therapy Jill Alexanders Scherrie [place], PT, DPT  05/03/2021, 7:44 AM

## 2021-05-03 NOTE — Progress Notes (Signed)
Speech Language Pathology Daily Session Note  Patient Details  Name: SHAKEA ISIP MRN: [id number] Date of Birth: February 24, 1941  Today's Date: 05/03/2021 SLP Individual Time: 6734-1937 SLP Individual Time Calculation (min): 59 min  Short Term Goals: Week 1: SLP Short Term Goal 1 (Week 1): Patient will demonstrate adequate problem solving when completing functional tasks/ADL's with minA verbal and visual cues. SLP Short Term Goal 2 (Week 1): Patient will be able to recall therapeutic interventions, strategies/recommendations with minA verbal cues. SLP Short Term Goal 3 (Week 1): Patient will demonstrate adequate level of awareness to errors and at least attempt to correct during functional tasks and with minA verbal and demonstration cues. SLP Short Term Goal 4 (Week 1): Patient will initiate to make requests for help/assistance and ask appropriate questions during completion of functional tasks, with minA verbal cues.  Skilled Therapeutic Interventions: Pt seen for skilled ST with focus on cognitive goals, pt in recliner and agreeable to skilled ST. Pt able to recall events of previous therapy session with Supervision A. Affect remains flat initially however pt becoming animated when speaking about 2 dogs and enjoying watching Jeopardy. Continuation of med management task with pt benefiting from min A for recall and understanding of meds provided re-education from previous session (minimal prescription meds). SLP facilitating mildly complex problem solving related to home environment by providing overall min A cues for 90% accuracy. Pt left in recliner with all needs within reach. Cont ST POC.   Pain Pain Assessment Pain Scale: 0-10 Pain Score: 0-No pain  Therapy/Group: Individual Therapy  Tacey Ruiz 05/03/2021, 9:57 AM

## 2021-05-04 DIAGNOSIS — R5381 Other malaise: Secondary | ICD-10-CM | POA: Diagnosis not present

## 2021-05-04 MED ORDER — METOPROLOL TARTRATE 50 MG PO TABS
62.5000 mg | ORAL_TABLET | Freq: Two times a day (BID) | ORAL | Status: DC
  Administered 2021-05-04 – 2021-05-10 (×12): 62.5 mg via ORAL
  Filled 2021-05-04 (×12): qty 1

## 2021-05-04 NOTE — Progress Notes (Addendum)
Patient ID: Brooke Harrington, female   DOB: [birth year], 81 y.o.   MRN: [id number]  SW made attempt to call patient daughter and provide conference updates, left detailed VM for a return call. SW will wait for follow up  Lavera Guise, [place] [phone]

## 2021-05-04 NOTE — Patient Care Conference (Signed)
Inpatient RehabilitationTeam Conference and Plan of Care Update Date: 05/04/2021   Time: 11:35 AM    Patient Name: Brooke Harrington      Medical Record Number: [id number]  Date of Birth: 14-Aug-1940 Sex: Female         Room/Bed: 5C02C/5C02C-01 Payor Info: Payor: MEDICARE / Plan: MEDICARE PART A AND B / Product Type: *No Product type* /    Admit Date/Time:  04/29/2021  3:17 PM  Primary Diagnosis:  Debility  Hospital Problems: Principal Problem:   Debility    Expected Discharge Date: Expected Discharge Date: 05/10/21  Team Members Present: Physician leading conference: Dr. Sula Soda Social Worker Present: Lavera Guise, BSW Nurse Present: Kennyth Arnold, RN OT Present: Other (comment) [facility] Laurence Spates, OT) SLP Present: Other (comment) Fae Pippin, SLP) PPS Coordinator present : Fae Pippin, SLP     Current Status/Progress Goal Weekly Team Focus  Bowel/Bladder   Pt is continent/incontinent of bowel/bladder  Pt will gain continence of bowel/bladder  Will assess qshift and PRN   Swallow/Nutrition/ Hydration   Dys 3/thin (secondary to lack of dentition)         ADL's   Min A bathing, Mod A dressing, Mod A toileting, functional transfers sit > stand and short ambulation min-mod A using RW (biggest barrier is visual deficits that impact safety/funciton)  supervision  Low vision strategies education, endurance, ADL retraining, dynamic balance   Mobility             Communication   n/a         Safety/Cognition/ Behavioral Observations  min-mod A     basic problem solving/reasoning/recall   Pain   Pt's pain level on a scale of 1-10 is a 4  Pt will become pain free  Will assess qshift and PRN   Skin   stage 2 right/left buttock, MASD  no new breakdown  assess skin q shift and prn     Discharge Planning:  discharging home with son and DIL. Can only provide PRN A (both work during the day)   Team Discussion: Looks better, cough improved. Sitting up more. Close to  baseline. Incontinent B/B, stage 2 right/left buttocks, MASD treating appropriately. Discharging home with son/his wife. Both work and can't provide 24/7 supervision. Weaned from oxygen and on room air at 95% with activity. Supervision bathing, min/mod assist dressing, mod assist toileting. Has low/poor vision, working on strategies to compensate. Ambulated 15 ft with contact guard assist. Requires lots of time and cues. Dys 3 diet, thin liquids due to dentition. Min/mod assist for cognition working on recall, memory.  Patient on target to meet rehab goals: Supervision goals ADL's, mod I/supervision mobility, and supervision with SLP.  *See Care Plan and progress notes for long and short-term goals.   Revisions to Treatment Plan:  Weaned from oxygen.  Teaching Needs: Family education, medication management, skin/wound care, safety awareness, transfer/gait training, etc.  Current Barriers to Discharge: Decreased caregiver support, Home enviroment access/layout, Incontinence, Wound care, Lack of/limited family support, Medication compliance, and vision.  Possible Resolutions to Barriers: Family education Follow-up PT/OT/SLP Order recommended DME Safety strategies for poor vision     Medical Summary Current Status: obesity BMI 32.29, cough, cognitive impairments, hypoxia, obesity BMI 32.29, impaired vision, labile blood pressure  Barriers to Discharge: Medical stability;Behavior;Weight  Barriers to Discharge Comments: obesity BMI 32.29, cough, cognitive impairments, hypoxia, impaired vision, labile blood pressure Possible Resolutions to [employer] Focus: provide dietary education, conitnue to monitor cough, weaned off oxygen, continue to monitor  blood pressure TID and titrate medications as tolerated   Continued Need for Acute Rehabilitation Level of Care: The patient requires daily medical management by a physician with specialized training in physical medicine and rehabilitation for  the following reasons: Direction of a multidisciplinary physical rehabilitation program to maximize functional independence : Yes Medical management of patient stability for increased activity during participation in an intensive rehabilitation regime.: Yes Analysis of laboratory values and/or radiology reports with any subsequent need for medication adjustment and/or medical intervention. : Yes   I attest that I was present, lead the team conference, and concur with the assessment and plan of the team.   Tennis Must 05/04/2021, 2:14 PM

## 2021-05-04 NOTE — Progress Notes (Signed)
Speech Language Pathology Daily Session Note  Patient Details  Name: Brooke Harrington MRN: [id number] Date of Birth: 11-26-1940  Today's Date: 05/04/2021 SLP Individual Time: 1435-1530 SLP Individual Time Calculation (min): 55 min  Short Term Goals: Week 1: SLP Short Term Goal 1 (Week 1): Patient will demonstrate adequate problem solving when completing functional tasks/ADL's with minA verbal and visual cues. SLP Short Term Goal 2 (Week 1): Patient will be able to recall therapeutic interventions, strategies/recommendations with minA verbal cues. SLP Short Term Goal 3 (Week 1): Patient will demonstrate adequate level of awareness to errors and at least attempt to correct during functional tasks and with minA verbal and demonstration cues. SLP Short Term Goal 4 (Week 1): Patient will initiate to make requests for help/assistance and ask appropriate questions during completion of functional tasks, with minA verbal cues.  Skilled Therapeutic Interventions:   Patient seen for skilled ST session focusing on cognitive function goals. Patient sitting up in recliner and watching TV when SLP entered room. She was significantly more animated and expressive as compared to day of evaluation when she had a flat affect and monotone voice. She was able to recall and talk about therapy tasks completed today. She demonstrated appropriate level of attention and active problem solving during mildly complex problem solving tasks. She informed SLP that she will be getting cataract surgery some time after CIR stay and that she was wondering if it was painful. SLP and patient then discussed this, with SLP looking up general information about the surgery to patient. As patient appears to be significantly improved cognitively, SLP plans to discharge her from ST services after tomorrow's session.  Pain Pain Assessment Pain Scale: 0-10 Pain Score: 0-No pain  Therapy/Group: Individual Therapy  Angela Nevin, MA,  CCC-SLP Speech Therapy

## 2021-05-04 NOTE — Progress Notes (Signed)
PROGRESS NOTE   Subjective/Complaints: Team conference held today Able to wean off to room air Denies shortness of breath Continues to have cough- not requiring prn   ROS:  Pt denies SOB, abd pain, CP, N/V/C/D, and vision changes, +cough  Objective:   No results found. Recent Labs    05/02/21 0619  WBC 6.5  HGB 10.2*  HCT 32.4*  PLT 359   Recent Labs    05/02/21 0619  NA 138  K 4.6  CL 104  CO2 31  GLUCOSE 92  BUN 14  CREATININE 0.59  CALCIUM 8.1*    Intake/Output Summary (Last 24 hours) at 05/04/2021 1211 Last data filed at 05/04/2021 0740 Gross per 24 hour  Intake 600 ml  Output --  Net 600 ml     Pressure Injury 04/29/21 Buttocks Left Stage 2 -  Partial thickness loss of dermis presenting as a shallow open injury with a red, pink wound bed without slough. healing s1 blanchable as of 12/30 (Active)  04/29/21 1540  Location: Buttocks  Location Orientation: Left  Staging: Stage 2 -  Partial thickness loss of dermis presenting as a shallow open injury with a red, pink wound bed without slough.  Wound Description (Comments): healing s1 blanchable as of 12/30  Present on Admission: Yes     Pressure Injury 04/29/21 Buttocks Right Stage 2 -  Partial thickness loss of dermis presenting as a shallow open injury with a red, pink wound bed without slough. healing s2, blanchable as of 12/30 (Active)  04/29/21 1541  Location: Buttocks  Location Orientation: Right  Staging: Stage 2 -  Partial thickness loss of dermis presenting as a shallow open injury with a red, pink wound bed without slough.  Wound Description (Comments): healing s2, blanchable as of 12/30  Present on Admission: Yes    Physical Exam: Vital Signs Blood pressure (!) 182/57, pulse 85, temperature 97.8 F (36.6 C), temperature source Oral, resp. rate 18, height 5' (1.524 m), weight 75 kg, SpO2 95 %.    General: awake, alert, appropriate,  sleepy; laying supine in bed; wearing 2L O2 by [place]; NT in room; NAD, BMI 32.29 HENT: conjugate gaze; oropharynx moist CV: regular rate; no JVD Pulmonary:- wheezed slightly- decreased at bases B/L- otherwise good GI: soft, NT, ND, (+)BS Psychiatric: appropriate but very flat Neurological: alert  Musculoskeletal:     Right lower leg: Edema present.     Left lower leg: Edema present.  Skin:    General: Skin is warm. Groin very red/raw appearing B/L- but hard ot assess due to a lot of cream in place- buttocks similar- looks like MASD    Comments: Chronic changes in LE's, mild.   Neurological:     Comments: Patient is alert.  Impaired reasoning and recall. Sitting up in chair.  Oriented x3 and follows commands. Some delays in processing. Speech dysarthric/low volume. Able to provide biographical information. UE Motor 4/5. LE: 3/5 prox to 4/5 distally. No focal sensory abnl. No limb ataxia  Psychiatric:     Comments: Flat, slow to engage but cooperative.    Assessment/Plan: 1. Functional deficits which require 3+ hours per day of interdisciplinary therapy in a  comprehensive inpatient rehab setting. Physiatrist is providing close team supervision and 24 hour management of active medical problems listed below. Physiatrist and rehab team continue to assess barriers to discharge/monitor patient progress toward functional and medical goals  Care Tool:  Bathing    Body parts bathed by patient: Right arm, Left arm, Chest, Abdomen, Right upper leg, Left upper leg, Right lower leg, Left lower leg, Face         Bathing assist Assist Level: Supervision/Verbal cueing     Upper Body Dressing/Undressing Upper body dressing   What is the patient wearing?: Dress    Upper body assist Assist Level: Supervision/Verbal cueing    Lower Body Dressing/Undressing Lower body dressing      What is the patient wearing?: Pants, Incontinence brief     Lower body assist Assist for lower body dressing:  Moderate Assistance - Patient 50 - 74%     Toileting Toileting    Toileting assist Assist for toileting: Maximal Assistance - Patient 25 - 49%     Transfers Chair/bed transfer  Transfers assist     Chair/bed transfer assist level: Contact Guard/Touching assist     Locomotion Ambulation   Ambulation assist      Assist level: Contact Guard/Touching assist Assistive device: Walker-rolling Max distance: 18'   Walk 10 feet activity   Assist  Walk 10 feet activity did not occur: Safety/medical concerns  Assist level: Contact Guard/Touching assist Assistive device: Walker-rolling   Walk 50 feet activity   Assist Walk 50 feet with 2 turns activity did not occur: Safety/medical concerns         Walk 150 feet activity   Assist Walk 150 feet activity did not occur: Safety/medical concerns         Walk 10 feet on uneven surface  activity   Assist Walk 10 feet on uneven surfaces activity did not occur: Safety/medical concerns         Wheelchair     Assist Is the patient using a wheelchair?: Yes Type of Wheelchair: Manual Wheelchair activity did not occur: Safety/medical concerns         Wheelchair 50 feet with 2 turns activity    Assist    Wheelchair 50 feet with 2 turns activity did not occur: Safety/medical concerns       Wheelchair 150 feet activity     Assist  Wheelchair 150 feet activity did not occur: Safety/medical concerns       Blood pressure (!) 182/57, pulse 85, temperature 97.8 F (36.6 C), temperature source Oral, resp. rate 18, height 5' (1.524 m), weight 75 kg, SpO2 95 %.  Medical Problem List and Plan: 1. Functional deficits. debility secondary to pneumonia/acute respiratory failure.  Pt also with resolving metabolic encephalopathy             -patient may shower             -ELOS/Goals: 7-10 days/ mod I for basic mobility and self-care  Continue CIR- PT and OT  -Interdisciplinary Team Conference today    2.  Impaired mobility: Continue Lovenox             -antiplatelet therapy: Aspirin 81 mg daily 3. Pain Management: Tylenol as needed 4. Mood: con't emotional support             -antipsychotic agents: N/A 5. Neuropsych: This patient is capable of making decisions on her own behalf. 6. Skin/Wound Care: Routine skin checks 7. Fluids/Electrolytes/Nutrition: encourage PO             -  pt is edentulous. I asked her if she would like chopped food and she said yes--->ordered D3 diet to start 8.  SVT.  Patient did initially require IV adenosine successful cardioversion.  Continue Lopressor 50 mg twice daily. Cardiology services follow-up             -monitor HR with activities 9.  Obesity BMI 32.29: provide dietary education 10. Pneumonia: complete course of tamiflu and augmentin             -wean from oxygen as able             -IS, OOB  Start robitussin DM q4H prn.   1/2- wean O2 as tolerated 11. Cognitive impairment: placed nursing order to request assistance with meals and meals out of bed in chair 12. Diarrhea: continue imodium.  13. MASD in groin/buttocks: continue nystatin cream.  14. Hypertension: creatinine stable. Increase lopressor to 62.5mg  BID.  15. Impaired reasoning and recall: continue SLP.     LOS: 5 days A FACE TO FACE EVALUATION WAS PERFORMED  Clint Bolder P Rufus Beske 05/04/2021, 12:11 PM

## 2021-05-04 NOTE — Progress Notes (Signed)
Physical Therapy Session Note  Patient Details  Name: Brooke Harrington MRN: [id number] Date of Birth: 07/31/1940  Today's Date: 05/04/2021 PT Individual Time: 1025-8527 PT Individual Time Calculation (min): 83 min   Short Term Goals: Week 1:  PT Short Term Goal 1 (Week 1): Pt will demonstrate consistent CGA with performance of bed mobility. PT Short Term Goal 2 (Week 1): Pt will perform all functional standing transfers at [facility]. PT Short Term Goal 3 (Week 1): Pt will initiate curb step training. PT Short Term Goal 4 (Week 1): Pt will ambulate 25' consistently with CGA and w/c follow for fatigue.  Skilled Therapeutic Interventions/Progress Updates:    Pt received in recliner and agreeable to therapy.  No complaint of pain. Pt required extended rest breaks throughout session d/t fatigue. Sit to stand with CGA and gait with CGA and RW up to 45 ft in longest bout. After first ambulating x 12 ft, pt requested to rest and demoed O2=80. Donned 1 L of O2 and sats recovered quickly. Pt then ambulated x 100 ft with 3 seated rest breaks. Titrated O2 down to  .5L and pt maintained >93% during gait. Pt seems to be self limiting, as she was able to continue walking after becoming fatigued with encouragement and a goal to walk to. Pt transported to therapy gym for energy conservation and transferred to nustep with CGA. Nustep 6 x 1 min with 30 sec rest breaks at level 3. After second bout, O2 noted to be 98% so next bout attempted on room air. Maintained >93% for one more bout, then dropped to 90%. Returned to >93% on .5L within several minutes. Pt directed in Sit to stand 3 x 10 with 2-3 minute rest breaks for improved performance. After last set, pt became SOB and demoed O2=68%. Double checked on different finger and O2=86%. Did not rise after several minutes on  .5L, so titrated up to 1L and O2 rose quickly. Maintained on 1L to end of session and demoing O2 >95% throughout. The following performed with 4 lb ball  for endurance and activity tolerance: -tossing ball x  10 - chest press x 10 -overhead press x 10  Pt then transported back to room and performed ambulatory transfer back to recliner in the same manner. Pt was left with all needs in reach and alarm active.   Therapy Documentation Precautions:  Precautions Precautions: Fall Precaution Comments: 2L O2 via [place], droplet precautions, impaired vision due to cataracts Restrictions Weight Bearing Restrictions: No    Therapy/Group: Individual Therapy  Juluis Rainier 05/04/2021, 1:20 PM

## 2021-05-04 NOTE — Progress Notes (Signed)
Occupational Therapy Session Note  Patient Details  Name: Brooke Harrington MRN: [id number] Date of Birth: 1940/11/04  Today's Date: 05/04/2021 OT Individual Time: 6415-8309 OT Individual Time Calculation (min): 68 min    Short Term Goals: Week 1:  OT Short Term Goal 1 (Week 1): Pt will complete LB dressing with Min A using adaptive strategies OT Short Term Goal 2 (Week 1): Pt will standing at the sink for ~2 minutes while engaging in grooming tasks to increase standing endurance OT Short Term Goal 3 (Week 1): Pt will complete 1/3 components of toileting with no more than Min balance assistance while maintaining stable 02 sats  Skilled Therapeutic Interventions/Progress Updates:  Skilled OT intervention completed with focus on ADL retraining, shower transfers, energy conservation and pursed lip breathing education. Pt received supine in bed, asleep, easily woken and agreeable to session. Pt without [place] on, with cord hanging over recliner/floor. Checked SPO2, with pt at 96% sat on RA, desat to 91% after shower with pursed lip breathing encouraged and returned to 96% sat on RA. Pt completed bed mobility with bed rail as assist, with supervision. Sit > stand using RW with CGA, then short ambulatory transfer to TTB in shower with CGA for balance and verbal cues for visual scanning due to impaired vision. Pt completed shower transfer via sit pivot with CGA for balance but mod verbal and tactile cues needed for pt to motor plan/sequence the method. Doffed clothing with mod A for time management. Pt required supervision for bathing, with assist needed only for handing items to pt and for cues to use grab bars during leaning. Pt noticeably mouth breathing, with education provided on energy conservation and breathing technique needed for showers. Therapist removed saturated sacral dressing, with LPN in room to apply new one. Therapist threaded LB clothing with max A for time/safety with pt's feet being off ground  due to height of TTB, then pt sit > stand using RW with CGA for LPN to apply dressing. Donned pants over hips with pt maintaining balance during ADLs for total of at least 5 mins. Short ambulatory transfer with RW and CGA to recliner, with environmental scanning cues provided. Pt left seated in recliner, with comb for grooming, chair alarm on, and all needs in reach at end of session.   Therapy Documentation Precautions:  Precautions Precautions: Fall Precaution Comments: 2L O2 via [place], droplet precautions, impaired vision due to cataracts Restrictions Weight Bearing Restrictions: No  Pain: No c/o pain   Therapy/Group: Individual Therapy  Yadriel Kerrigan E Zyaira Vejar 05/04/2021, 7:30 AM

## 2021-05-05 MED ORDER — MAGNESIUM HYDROXIDE 400 MG/5ML PO SUSP
5.0000 mL | Freq: Every day | ORAL | Status: DC | PRN
  Administered 2021-05-06: 5 mL via ORAL
  Filled 2021-05-05: qty 30

## 2021-05-05 NOTE — Progress Notes (Signed)
PROGRESS NOTE   Subjective/Complaints: No new complaints this morning. She is working with Jonny RuizJohn SLP Droplet precautions d/ced Cough stable.    ROS:  Pt denies SOB, abd pain, CP, N/V/C/D, and vision changes, +cough  Objective:   No results found. No results for input(s): WBC, HGB, HCT, PLT in the last 72 hours.  No results for input(s): NA, K, CL, CO2, GLUCOSE, BUN, CREATININE, CALCIUM in the last 72 hours.   Intake/Output Summary (Last 24 hours) at 05/05/2021 1000 Last data filed at 05/04/2021 1820 Gross per 24 hour  Intake 690 ml  Output --  Net 690 ml     Pressure Injury 04/29/21 Buttocks Left Stage 2 -  Partial thickness loss of dermis presenting as a shallow open injury with a red, pink wound bed without slough. healing s1 blanchable as of 12/30 (Active)  04/29/21 1540  Location: Buttocks  Location Orientation: Left  Staging: Stage 2 -  Partial thickness loss of dermis presenting as a shallow open injury with a red, pink wound bed without slough.  Wound Description (Comments): healing s1 blanchable as of 12/30  Present on Admission: Yes     Pressure Injury 04/29/21 Buttocks Right Stage 2 -  Partial thickness loss of dermis presenting as a shallow open injury with a red, pink wound bed without slough. healing s2, blanchable as of 12/30 (Active)  04/29/21 1541  Location: Buttocks  Location Orientation: Right  Staging: Stage 2 -  Partial thickness loss of dermis presenting as a shallow open injury with a red, pink wound bed without slough.  Wound Description (Comments): healing s2, blanchable as of 12/30  Present on Admission: Yes    Physical Exam: Vital Signs Blood pressure (!) 154/90, pulse 78, temperature 98 F (36.7 C), temperature source Oral, resp. rate 19, height 5' (1.524 m), weight 75 kg, SpO2 99 %.    General: awake, alert, appropriate, sleepy; laying supine in bed; wearing 2L O2 by [place]; NT in room;  NAD, BMI 32.29 HENT: conjugate gaze; oropharynx moist CV: regular rate; no JVD Pulmonary:- wheezed slightly- decreased at bases B/L- otherwise good GI: soft, NT, ND, (+)BS Psychiatric: appropriate but very flat Neurological: alert  Musculoskeletal:     Right lower leg: Edema present.     Left lower leg: Edema present.  Skin:    General: Skin is warm. Groin very red/raw appearing B/L- but hard ot assess due to a lot of cream in place- buttocks similar- looks like MASD    Comments: Chronic changes in LE's, mild.   Neurological:     Comments: Patient is alert.  Impaired reasoning and recall. Sitting up in chair.  Oriented x3 and follows commands. Some delays in processing. Speech dysarthric/low volume. Able to provide biographical information. UE Motor 4/5. LE: 3/5 prox to 4/5 distally. No focal sensory abnl. No limb ataxia  Psychiatric:     Comments: More animated and expressive   Assessment/Plan: 1. Functional deficits which require 3+ hours per day of interdisciplinary therapy in a comprehensive inpatient rehab setting. Physiatrist is providing close team supervision and 24 hour management of active medical problems listed below. Physiatrist and rehab team continue to assess barriers to discharge/monitor  patient progress toward functional and medical goals  Care Tool:  Bathing    Body parts bathed by patient: Right arm, Left arm, Chest, Abdomen, Right upper leg, Left upper leg, Right lower leg, Left lower leg, Face         Bathing assist Assist Level: Supervision/Verbal cueing     Upper Body Dressing/Undressing Upper body dressing   What is the patient wearing?: Dress    Upper body assist Assist Level: Supervision/Verbal cueing    Lower Body Dressing/Undressing Lower body dressing      What is the patient wearing?: Pants, Incontinence brief     Lower body assist Assist for lower body dressing: Moderate Assistance - Patient 50 - 74%     Toileting Toileting     Toileting assist Assist for toileting: Maximal Assistance - Patient 25 - 49%     Transfers Chair/bed transfer  Transfers assist     Chair/bed transfer assist level: Contact Guard/Touching assist     Locomotion Ambulation   Ambulation assist      Assist level: Contact Guard/Touching assist Assistive device: Walker-rolling Max distance: 18'   Walk 10 feet activity   Assist  Walk 10 feet activity did not occur: Safety/medical concerns  Assist level: Contact Guard/Touching assist Assistive device: Walker-rolling   Walk 50 feet activity   Assist Walk 50 feet with 2 turns activity did not occur: Safety/medical concerns         Walk 150 feet activity   Assist Walk 150 feet activity did not occur: Safety/medical concerns         Walk 10 feet on uneven surface  activity   Assist Walk 10 feet on uneven surfaces activity did not occur: Safety/medical concerns         Wheelchair     Assist Is the patient using a wheelchair?: Yes Type of Wheelchair: Manual Wheelchair activity did not occur: Safety/medical concerns         Wheelchair 50 feet with 2 turns activity    Assist    Wheelchair 50 feet with 2 turns activity did not occur: Safety/medical concerns       Wheelchair 150 feet activity     Assist  Wheelchair 150 feet activity did not occur: Safety/medical concerns       Blood pressure (!) 154/90, pulse 78, temperature 98 F (36.7 C), temperature source Oral, resp. rate 19, height 5' (1.524 m), weight 75 kg, SpO2 99 %.  Medical Problem List and Plan: 1. Functional deficits. debility secondary to pneumonia/acute respiratory failure.  Pt also with resolving metabolic encephalopathy             -patient may shower             -ELOS/Goals: 7-10 days/ mod I for basic mobility and self-care  Continue CIR- PT and OT 2.  Impaired mobility: Continue Lovenox             -antiplatelet therapy: Aspirin 81 mg daily 3. Pain Management:  Tylenol as needed 4. Mood: con't emotional support             -antipsychotic agents: N/A 5. Neuropsych: This patient is capable of making decisions on her own behalf. 6. Skin/Wound Care: Routine skin checks 7. Fluids/Electrolytes/Nutrition: encourage PO             -pt is edentulous. I asked her if she would like chopped food and she said yes--->ordered D3 diet to start 8.  SVT.  Patient did initially require IV adenosine  successful cardioversion.  Continue Lopressor 50 mg twice daily. Cardiology services follow-up             -monitor HR with activities 9.  Obesity BMI 32.29: provide dietary education 10. Pneumonia: complete course of tamiflu and augmentin             -wean from oxygen as able             -IS, OOB  Start robitussin DM q4H prn.   1/2- wean O2 as tolerated 11. Cognitive impairment: placed nursing order to request assistance with meals and meals out of bed in chair 12. Diarrhea: continue imodium.  13. MASD in groin/buttocks: continue nystatin cream.  14. Hypertension: creatinine stable. Increase lopressor to 62.5mg  BID. Add milk of mag daily PRN.  15. Impaired reasoning and recall: has reached baseline, d/c SLP 16. Constipation: add milk of magnesium daily prn.     LOS: 6 days A FACE TO FACE EVALUATION WAS PERFORMED  Sahil Milner P Nocholas Damaso 05/05/2021, 10:00 AM

## 2021-05-05 NOTE — Progress Notes (Signed)
Patient ID: Brooke Harrington, female   DOB: 1940-07-23, 81 y.o.   MRN: [id number]  Family eduction scheduled on Saturday 1/7 1-4. Sw will provide [facility] resources to family.

## 2021-05-05 NOTE — Progress Notes (Signed)
Patient ID: Brooke Harrington, female   DOB: 12-05-1940, 81 y.o.   MRN: [id number]  Sw made attempt to call patient son, Brooke Harrington. Left Vm  Lavera Guise, BSW

## 2021-05-05 NOTE — Progress Notes (Signed)
Patient ID: Brooke Harrington, female   DOB: March 09, 1941, 81 y.o.   MRN: [id number] Spoke with IP representative regarding flu precautions/droplet precaution. Originally diagnoses 04/23/21; has been on droplet precautions since. No fever;  but persistent cough noted. Clarified able to discontinue droplet precautions if patient is post fever > 24 hours and had been on precautions for 7 days. Nurse made aware of updated order. Pamelia Hoit

## 2021-05-05 NOTE — Progress Notes (Signed)
Occupational Therapy Session Note  Patient Details  Name: Brooke Harrington MRN: [id number] Date of Birth: March 03, 1941  Today's Date: 05/05/2021 OT Individual Time: 0800-0920 OT Individual Time Calculation (min): 80 min    Short Term Goals: Week 2:  OT Short Term Goal 1 (Week 2): STG = LTG due to ELOS  Skilled Therapeutic Interventions/Progress Updates:    Pt in bed to start session with agreement to participate in OT.  She was able to transfer to the EOB with supervision and then complete donning slip on pants with min guard assist.  She voiced the need to perform toileting task so therapist had her complete stand pivot transfer to the [facility] with min assist and not device.  She was able to complete toileting tasks with min assist sit to stand and then transfer back to the EOB.  Supervision for donning gripper socks, as she did not donn them prior to transfer secondary to therapist having donned TEDs and then having the urgency to void.  She ambulated to the sink with min guard assist for washing her hands with use of the RW for support.  Max instructional cueing for hand placement to push up from the surface she is transferring from and to reach back, instead of pulling up on the RW.  O2 sats monitored throughout transfers in the room at 90% or greater on room air with peak of 94% when resting for a long period.  She transferred to the wheelchair and then was taken down to the ortho gym for part of session.  She completed 2 standing intervals of 2 mins while completing Visual Scanning Program.  Dots were increased to largest setting with blue contrast on black background.  First set was completed with close supervision with two mins of standing and pt alternating UE use.  She would need occasional min instructional cueing to locate dots on the left side of the screen secondary to increased glare.  This improved when blind was closed.  Oxygen sats after standing were 92% on 2Ls nasal cannula.  Second interval  of standing was with min guard without an assistive device.  This was much tougher secondary to not having UE support on the walker and she fatigued faster, requesting to sit down.  Therapist encouraged continuing until the two min interval was completed.  Oxygen sats were still above 91% on room air.  Next, had her complete tub transfers with use of the tub bench and RW in the ADL apartment.  She was able to complete with min guard assist and mod instructional cueing for backing all the way up to the bench and then reaching back to the seat.  Finished session with return to the room and pt ambulating with the RW from the hallway just outside of the room to her recliner to rest.  Oxygen sats 92% post mobility on room air after ambulating at min guard for 30'.  Call button and phone in reach with safety alarm pad in place.    Therapy Documentation Precautions:  Precautions Precautions: Fall Precaution Comments: 2L O2 via [place], droplet precautions, impaired vision due to cataracts Restrictions Weight Bearing Restrictions: No  Pain: Pain Assessment Pain Scale: 0-10 Pain Score: 0-No pain ADL: See Care Tool Section for some details of mobility   Therapy/Group: Individual Therapy  Brooke Harrington OTR/L 05/05/2021, 12:29 PM

## 2021-05-05 NOTE — Progress Notes (Signed)
Speech Language Pathology Discharge Summary  Patient Details  Name: Brooke Harrington MRN: [id number] Date of Birth: [birth year]  Today's Date: 05/05/2021 SLP Individual Time: 0800-0900 SLP Individual Time Calculation (min): 60 min   Skilled Therapeutic Interventions:  Patient seen for skilled ST session focusing on cognitive function goals. When SLP entered room, patient awake, watching TV in bed. She was not as interactive as she had been previous date, and responses were brief, affect fairly flat. SLP attempted to determine what patient is able to see knowing she has visual impairment (cataracts). She has told SLP in the past that she is able to play Candy Crush game on her phone but when asked if she could read large print, she just states, "I can't see that" and when asked further to explain her vision, she just says, "I can't explain it". At some point during discussion, patient reported how "I like talking about dogs" and she smiled. She then became more animated and expressive talking about dogs she has had, etc. She did demonstrate recall of most of the things she and SLP discussed yesterday. She was left in room with all needs in place. She is appropriate for discharge from [place] at this time.    Patient has met 3 of 4 long term goals.  Patient to discharge at overall Supervision level.  Reasons goals not met: Paitent at supervision [facility] with recall of new information which appears to be at or near baseline. She is unable to effectively use visual aides/calendars etc secondary visual impairment.   Clinical Impression/Discharge Summary: Patient progressed to meet 3 of 4 LTG's and overall alertness, attention and participation did improve. As per her son Todd's initial report, patient appears to be at or near her cognitive baseline. SLP noted that patient's willingness to participate was highly impacted by her overall interest in topic as she would be very animated and happy talking  about dogs and other pets but seemingly disinterested and more flat affect when discussing or engaging in more functional topics/tasks.  As patient's family already assists with more complex tasks such as financial management, plan is for her to have 24 hour supervision and patient appears to be functioning at or near cognitive baseline, she is appropriate for discharge from [place] services at this time.  Care Partner:  Caregiver Able to Provide Assistance: Yes  Type of Caregiver Assistance: Physical;Cognitive  Recommendation:  24 hour supervision/assistance;None      Equipment: None for speech   Reasons for discharge: Treatment goals met   Patient/Family Agrees with Progress Made and Goals Achieved: Yes    Sonia Baller, MA, CCC-SLP Speech Therapy

## 2021-05-05 NOTE — Progress Notes (Signed)
Physical Therapy Session Note  Patient Details  Name: Brooke Harrington MRN: [id number] Date of Birth: [birth year]  Today's Date: 05/05/2021 PT Individual Time: 1302-1415 PT Individual Time Calculation (min): 73 min   Short Term Goals: Week 1:  PT Short Term Goal 1 (Week 1): Pt will demonstrate consistent CGA with performance of bed mobility. PT Short Term Goal 2 (Week 1): Pt will perform all functional standing transfers at [facility]. PT Short Term Goal 3 (Week 1): Pt will initiate curb step training. PT Short Term Goal 4 (Week 1): Pt will ambulate 25' consistently with CGA and w/c follow for fatigue.  Skilled Therapeutic Interventions/Progress Updates:  Pt received supine in bed, son and DIL present. Pt denied pain and was agreeable to PT. Son and DIL under impression that family training was to occur during session, informed therapist they both took time off work to be there. CSW and care team notified of miscommunication and formal family training scheduled for 1/7. Emphasis of session on family training w/stairs and car to prepare for DC home next week. Pt requested urgency to void, performed supine <>sit EOB w/S* and sit <>stand to RW w/CGA. Pt ambulated 15' to toilet w/RW and performed toileting w/CGA. Sit <>stand from toilet w/CGA and pt ambulated 10' to [facility] w/RW and CGA. Pt transported to ortho gym w/total A for time management and energy conservation and performed car transfer w/RW and CGA. Noted difficulty sitting in seat first due to high car seat height and her short stature, min verbal cues for hand placement to assist pulling into car. Pt then transported to ADL suite and practiced bed mobility to high bed w/CGA. Noted continued difficulty w/transfer due to elevated height of bed and short stature. Family encouraged to lower pt's bed height at home by removing bed spring to ensure safe transfer, family verbalized agreement. Pt then transported to main gym w/total A for time management Pt  demonstrated ascending/descending single 6" step w/BUE support on rails w/CGA. Noted significant forward flexed posture and overreliance on BUEs on rails. Pt has 1 STE from side entrance w/no rails and 2 STE on front entrance w/no rails. Demonstrated ascending/descending curb w/RW to family to practice on Saturday during family training w/pt. Unable to practice w/pt today due to time constraints.  Pt transported back to room w/total A and was left seated in WC in room, family present, all needs in reach.   Therapy Documentation Precautions:  Precautions Precautions: Fall Precaution Comments: 2L O2 via [place], droplet precautions, impaired vision due to cataracts Restrictions Weight Bearing Restrictions: No   Therapy/Group: Individual Therapy Cruzita Lederer Kellie Chisolm, PT, DPT  05/05/2021, 7:54 AM

## 2021-05-06 LAB — CREATININE, SERUM
Creatinine, Ser: 0.57 mg/dL (ref 0.44–1.00)
GFR, Estimated: >60 mL/min (ref >60)

## 2021-05-06 MED ORDER — SENNOSIDES-DOCUSATE SODIUM 8.6-50 MG PO TABS
2.0000 | ORAL_TABLET | Freq: Every day | ORAL | Status: DC
  Administered 2021-05-06 – 2021-05-09 (×4): 2 via ORAL
  Filled 2021-05-06 (×4): qty 2

## 2021-05-06 MED ORDER — MAGNESIUM HYDROXIDE 400 MG/5ML PO SUSP
15.0000 mL | Freq: Every day | ORAL | Status: DC | PRN
  Administered 2021-05-09: 15 mL via ORAL
  Filled 2021-05-06: qty 30

## 2021-05-06 NOTE — Progress Notes (Signed)
PROGRESS NOTE   Subjective/Complaints: Bleeding per rectum this morning Has been told not to strain on commode so is trying not to Discussed with nursing giving milk of magnesia 24mL and if this is not effective to please let me know for a higher dose  ROS:  Pt denies SOB, abd pain, CP, N/V/C/D, and vision changes, +cough- improved to a tickle  Objective:   No results found. No results for input(s): WBC, HGB, HCT, PLT in the last 72 hours.  Recent Labs    05/06/21 0551  CREATININE 0.57     Intake/Output Summary (Last 24 hours) at 05/06/2021 1207 Last data filed at 05/06/2021 0734 Gross per 24 hour  Intake 417 ml  Output --  Net 417 ml     Pressure Injury 04/29/21 Buttocks Left Stage 2 -  Partial thickness loss of dermis presenting as a shallow open injury with a red, pink wound bed without slough. healing s1 blanchable as of 12/30 (Active)  04/29/21 1540  Location: Buttocks  Location Orientation: Left  Staging: Stage 2 -  Partial thickness loss of dermis presenting as a shallow open injury with a red, pink wound bed without slough.  Wound Description (Comments): healing s1 blanchable as of 12/30  Present on Admission: Yes     Pressure Injury 04/29/21 Buttocks Right Stage 2 -  Partial thickness loss of dermis presenting as a shallow open injury with a red, pink wound bed without slough. healing s2, blanchable as of 12/30 (Active)  04/29/21 1541  Location: Buttocks  Location Orientation: Right  Staging: Stage 2 -  Partial thickness loss of dermis presenting as a shallow open injury with a red, pink wound bed without slough.  Wound Description (Comments): healing s2, blanchable as of 12/30  Present on Admission: Yes    Physical Exam: Vital Signs Blood pressure (!) 160/60, pulse 84, temperature 97.8 F (36.6 C), temperature source Oral, resp. rate 14, height 5' (1.524 m), weight 75 kg, SpO2 94 %.    General: awake,  alert, appropriate, sleepy; laying supine in bed; wearing 2L O2 by Rachel; NT in room; NAD, BMI 32.29 HENT: conjugate gaze; oropharynx moist CV: regular rate; no JVD Pulmonary:- wheezed slightly- decreased at bases B/L- otherwise good GI: soft, NT, ND, (+)BS Psychiatric: appropriate but very flat Neurological: alert  Musculoskeletal:     Right lower leg: Edema present.     Left lower leg: Edema present.  Skin:    General: Skin is warm. Groin very red/raw appearing B/L- but hard ot assess due to a lot of cream in place- buttocks similar- looks like MASD    Comments: Chronic changes in LE's, mild.   Neurological:     Comments: Patient is alert.  Impaired reasoning and recall. Sitting up in chair.  Oriented x3 and follows commands. Some delays in processing. Speech dysarthric/low volume. Able to provide biographical information. UE Motor 4/5. LE: 3/5 prox to 4/5 distally. No focal sensory abnl. No limb ataxia  Psychiatric:     Comments: More animated and expressive GU: on commode, blood per rectum   Assessment/Plan: 1. Functional deficits which require 3+ hours per day of interdisciplinary therapy in a comprehensive inpatient  rehab setting. Physiatrist is providing close team supervision and 24 hour management of active medical problems listed below. Physiatrist and rehab team continue to assess barriers to discharge/monitor patient progress toward functional and medical goals  Care Tool:  Bathing    Body parts bathed by patient: Right arm, Left arm, Chest, Abdomen, Right upper leg, Left upper leg, Right lower leg, Left lower leg, Face         Bathing assist Assist Level: Supervision/Verbal cueing     Upper Body Dressing/Undressing Upper body dressing   What is the patient wearing?: Pull over shirt    Upper body assist Assist Level: Set up assist    Lower Body Dressing/Undressing Lower body dressing      What is the patient wearing?: Pants     Lower body assist Assist for  lower body dressing: Contact Guard/Touching assist     Toileting Toileting    Toileting assist Assist for toileting: Moderate Assistance - Patient 50 - 74%     Transfers Chair/bed transfer  Transfers assist     Chair/bed transfer assist level: Contact Guard/Touching assist     Locomotion Ambulation   Ambulation assist      Assist level: Contact Guard/Touching assist Assistive device: Walker-rolling Max distance: 149ft   Walk 10 feet activity   Assist  Walk 10 feet activity did not occur: Safety/medical concerns  Assist level: Contact Guard/Touching assist Assistive device: Walker-rolling   Walk 50 feet activity   Assist Walk 50 feet with 2 turns activity did not occur: Safety/medical concerns  Assist level: Contact Guard/Touching assist Assistive device: Walker-rolling    Walk 150 feet activity   Assist Walk 150 feet activity did not occur: Safety/medical concerns  Assist level: Contact Guard/Touching assist Assistive device: Walker-rolling    Walk 10 feet on uneven surface  activity   Assist Walk 10 feet on uneven surfaces activity did not occur: Safety/medical concerns         Wheelchair     Assist Is the patient using a wheelchair?: Yes Type of Wheelchair: Manual Wheelchair activity did not occur: Safety/medical concerns         Wheelchair 50 feet with 2 turns activity    Assist    Wheelchair 50 feet with 2 turns activity did not occur: Safety/medical concerns       Wheelchair 150 feet activity     Assist  Wheelchair 150 feet activity did not occur: Safety/medical concerns       Blood pressure (!) 160/60, pulse 84, temperature 97.8 F (36.6 C), temperature source Oral, resp. rate 14, height 5' (1.524 m), weight 75 kg, SpO2 94 %.  Medical Problem List and Plan: 1. Functional deficits. debility secondary to pneumonia/acute respiratory failure.  Pt also with resolving metabolic encephalopathy              -patient may shower             -ELOS/Goals: 7-10 days/ mod I for basic mobility and self-care  Continue CIR- PT and OT  HFU scheduled 2.  Impaired mobility: Continue Lovenox             -antiplatelet therapy: Aspirin 81 mg daily 3. Pain Management: Tylenol as needed 4. Mood: con't emotional support             -antipsychotic agents: N/A 5. Neuropsych: This patient is capable of making decisions on her own behalf. 6. Skin/Wound Care: Routine skin checks 7. Fluids/Electrolytes/Nutrition: encourage PO             -  pt is edentulous. I asked her if she would like chopped food and she said yes--->ordered D3 diet to start 8.  SVT.  Patient did initially require IV adenosine successful cardioversion.  Continue Lopressor 50 mg twice daily. Cardiology services follow-up             -monitor HR with activities 9.  Obesity BMI 32.29: provide dietary education 10. Pneumonia: complete course of tamiflu and augmentin             -weaned O2             -IS, OOB  Start robitussin DM q4H prn.  11. Cognitive impairment: placed nursing order to request assistance with meals and meals out of bed in chair 12. Diarrhea: continue imodium.  13. MASD in groin/buttocks: continue nystatin cream.  14. Hypertension: creatinine stable. Increase lopressor to 62.5mg  BID. Increase milk of mag to 72mL prn daily.  15. Impaired reasoning and recall: has reached baseline, d/c SLP 16. Constipation: add milk of magnesium daily prn. Blood per rectum likely secondary to straining, increase milk of mag to 34mL.    LOS: 7 days A FACE TO FACE EVALUATION WAS PERFORMED  Clide Deutscher Bellany Elbaum 05/06/2021, 12:07 PM

## 2021-05-06 NOTE — Progress Notes (Signed)
Physical Therapy Session Note  Patient Details  Name: Brooke Harrington MRN: [id number] Date of Birth: Dec 19, 1940  Today's Date: 05/06/2021 PT Individual Time: 1300-1340 PT Individual Time Calculation (min): 40 min   Short Term Goals: Week 1:  PT Short Term Goal 1 (Week 1): Pt will demonstrate consistent CGA with performance of bed mobility. PT Short Term Goal 2 (Week 1): Pt will perform all functional standing transfers at [facility]. PT Short Term Goal 3 (Week 1): Pt will initiate curb step training. PT Short Term Goal 4 (Week 1): Pt will ambulate 25' consistently with CGA and w/c follow for fatigue.  Skilled Therapeutic Interventions/Progress Updates:    Patient received reclined in bed, agreeable to PT. She denies pain, but endorses feeling "grouchy." She was able to come sit edge of bed with supervision and transfer to wc via stand pivot with CGA. PT transporting patient in wc to therapy gym for time management and energy conservation. She completed a total of 5 mins on the NuStep using B LE only on level 2. Multiple frequent rest breaks needed due to fatigue. She denies SOB. O2 98% on RA. Patient ambulating >145ft with RW and CGA before requesting a rest break. Patient ambulating short distance back to her room with RW and CGA. Bed alarm on, call light within reach.   Therapy Documentation Precautions:  Precautions Precautions: Fall Precaution Comments: 2L O2 via , droplet precautions, impaired vision due to cataracts Restrictions Weight Bearing Restrictions: No    Therapy/Group: Individual Therapy  Elizebeth Koller, PT, DPT, CBIS  05/06/2021, 7:53 AM

## 2021-05-06 NOTE — Progress Notes (Signed)
Occupational Therapy Session Note  Patient Details  Name: Brooke Harrington MRN: [id number] Date of Birth: 12-22-1940  Today's Date: 05/06/2021 OT Individual Time: [id number] & 1505-1530 OT Individual Time Calculation (min): 70 min & 25 min    Short Term Goals: Week 2:  OT Short Term Goal 1 (Week 2): STG = LTG due to ELOS  Skilled Therapeutic Interventions/Progress Updates:  Session 1 Skilled OT intervention completed with focus on ADL retraining, functional transfers, and activity tolerance. Pt received supine in bed asleep, easily woken. Pt c/o 6/10 R leg pain, requesting pain meds, with LPN in room to provide meds. Pt completed bed mobility with supervision. Seated EOB, pt participated in bathing with supervision, with therapist providing safe distance to determine pt's self-initiation with self care tasks vs heavy cueing this session, with pt demonstrating improved independence with completing tasks however still with increased time needed throughout. Pt able to locate clothing on bed rail, un prompted. Donned shirt with set up, then pants with supervision using BLEs on bed for ease of access with pt reporting she commonly uses this method at home at baseline. Pt sit > stand with supervision using RW, then reporting urgency to use toilet. Pt attempting to walk to bathroom with pants around knees, with cues needed for safety to donn pants over hips prior to walking. Pt was CGA using RW to toilet in bathroom, and total A to remove dirty brief. Incontinent void episode noted in brief and small trace of blood noticed on brief, with therapist checking sacral dressing with no visible blood found however traces of blood found in commode and active drip. Therapist notified LPN, with LPN in room to assess pt, with meds provided and therapist assisting with transfers during nursing care. MD in room to assess pt. Pt required max A for toileting tasks this session for time, then short ambulation to recliner using RW  and CGA. Pt left seated in recliner, with chair alarm on, and all needs in reach at end of session.  Session 2 Skilled OT intervention completed with focus on toilet transfers and BUE strengthening/endurance. Pt received upright in bed, reporting fatigue but agreeable to EOB exercises. Pt on RA, with asymptomatic breathing, and remained non SOB on RA rest of session. Pt completed bed mobility with mod I, then seated EOB participated in the following BUE exercises to promote endurance needed for self-care tasks:   3 pound dowel: Bicep flexion x15 Chest press x15 Front raises x15  Multimodal cues utilized to promote form throughout as low vision strategy. Pt verbalizing urgency to toilet, with pt completing short ambulatory transfer to toilet using RW with supervision. Pt found to have incontient void episode in brief, with total A to remove soiled brief. Note- small BM in toilet, with no blood present- LPN notified. Pt completed pericare and donning of pants over hips with CGA this session for balance. Pt able to wash hands at sink with supervision assist as pt using both forearms for support on counter. Pt left in long sitting in bed, with bed alarm and all needs in reach at end of session.    Therapy Documentation Precautions:  Precautions Precautions: Fall Precaution Comments: 2L O2 via [place], droplet precautions, impaired vision due to cataracts Restrictions Weight Bearing Restrictions: No  Pain: 6/10 R leg pain- achy, requesting pain meds, with LPN in room to provide meds.   Therapy/Group: Individual Therapy  Marcin Holte E Gibbs Naugle 05/06/2021, 7:29 AM

## 2021-05-06 NOTE — Progress Notes (Signed)
Physical Therapy Session Note  Patient Details  Name: Brooke Harrington MRN: [id number] Date of Birth: [birth year]  Today's Date: 05/06/2021 PT Individual Time: [id number] PT Individual Time Calculation (min): 54 min   Short Term Goals: Week 1:  PT Short Term Goal 1 (Week 1): Pt will demonstrate consistent CGA with performance of bed mobility. PT Short Term Goal 2 (Week 1): Pt will perform all functional standing transfers at [facility]. PT Short Term Goal 3 (Week 1): Pt will initiate curb step training. PT Short Term Goal 4 (Week 1): Pt will ambulate 25' consistently with CGA and w/c follow for fatigue.  Skilled Therapeutic Interventions/Progress Updates:   Received pt sitting in recliner, pt agreeable to PT treatment, and denied any pain during session but reported feeling "cranky" from not sleeping well. Session with emphasis on functional mobility/transfers, generalized strengthening, dynamic standing balance/coordination, gait training, curb navigation, and endurance. O2 sat 92% at rest and pt denied any SOB. Sit<>stand with RW and CGA and ambulated 62ft with RW and CGA to WC. Pt transported to/from room in [facility] total A for time management purposes. Pt reported having small threshold to navigate to enter home with no railings; therefore practiced curb navigation. Pt navigated 1 4in curb with RW and min A overall with mod cues for technique and RW safety - pt would benefit from continued practice with this technique. Sit<>stand with RW and CGA and pt ambulated 167ft with RW and CGA. O2 sat 84% increasing to 93% on RA with seated rest break and pursed lip breathing. Then, worked on blocked practice sit<>stands from [facility] without UE support 2x10 reps to fatigue. Pt with increased difficulty achieving full trunk/hip extension on last few reps due to fatigue. Pt performed standing BLE strengthening on [place] with BUE support at 20 cm/sec for x11 reps, x10 reps, and 2x20 reps with emphasis on glute/quad strengthening  and standing balance. Pt requested to return to bed and transferred WC<>bed stand<>pivot with RW and CGA and sit<>supine with supervision. Concluded session with pt sitting upright in bed, needs within reach, and bed alarm on.   Therapy Documentation Precautions:  Precautions Precautions: Fall Precaution Comments: 2L O2 via , droplet precautions, impaired vision due to cataracts Restrictions Weight Bearing Restrictions: No  Therapy/Group: Individual Therapy Alfonse Alpers PT, DPT   05/06/2021, 7:23 AM

## 2021-05-06 NOTE — Progress Notes (Addendum)
Patient ID: Brooke Harrington, female   DOB: 02-Dec-1940, 81 y.o.   MRN: [id number]  Patient [facility] referral sent to [facility] (formerly known as [employer]  *Patient approved

## 2021-05-06 NOTE — Progress Notes (Signed)
Patient ID: Brooke Harrington, female   DOB: 1940/06/09, 81 y.o.   MRN: [id number]  Levan Hurst, Transfer Bench and [facility] ordered through [employer]

## 2021-05-07 NOTE — Progress Notes (Signed)
Physical Therapy Session Note  Patient Details  Name: Brooke Harrington MRN: [id number] Date of Birth: August 05, 1940  Today's Date: 05/07/2021 PT Individual Time: 1345-1430 PT Individual Time Calculation (min): 45 min   Short Term Goals: Week 1:  PT Short Term Goal 1 (Week 1): Pt will demonstrate consistent CGA with performance of bed mobility. PT Short Term Goal 2 (Week 1): Pt will perform all functional standing transfers at [facility]. PT Short Term Goal 3 (Week 1): Pt will initiate curb step training. PT Short Term Goal 4 (Week 1): Pt will ambulate 25' consistently with CGA and w/c follow for fatigue.  Skilled Therapeutic Interventions/Progress Updates: Pt handed off from OT and agreeable to therapy.  Family members present for family ed.  Pt wheeled to main gym for time conservation.  Pt performed multiple sit to stand transfers w/ CGA, but verbal cues for hand placement.  Educated family and pt need for use of UEs for safe transitions.  Pt negotiated (1) 4" platform, simulating home set-up to front door.  Pt performed w/ CGA and RW, but verbal cues for sequencing as well as simultaneous education of family members.  Pt performed x 1 w/ PT and then w/ each family member w/ cueing for safe positioning as well as their verbal cueing.  Pt amb x 150' w/ family members and list to left, w/ c/o cataracts.  Pt wheeled to small gym for practice on simulator car.  Pt again reminded of UE placement for sit <> stand, but still requires cueing x 80% of time.  Pt able to perform on SUV height w/ CGA.  Pt returned to hallway outside of room.  Pt amb x 15' into room and to bed.  Pt required supervision for sit to supine and able to bridge hips to center of bed.  Bed alarm on and all needs in reach.  No other questions requested by family members.     Therapy Documentation Precautions:  Precautions Precautions: Fall Precaution Comments: 2L O2 via [place], droplet precautions, impaired vision due to  cataracts Restrictions Weight Bearing Restrictions: No General:   Vital Signs:  Pain:0/10      Therapy/Group: Individual Therapy  Lucio Edward 05/07/2021, 2:33 PM

## 2021-05-07 NOTE — Progress Notes (Signed)
Occupational Therapy Session Note  Patient Details  Name: Brooke Harrington MRN: [id number] Date of Birth: Mar 15, 1941  Today's Date: 05/07/2021 OT Individual Time: 1300-1345 OT Individual Time Calculation (min): 45 min    Short Term Goals: Week 2:  OT Short Term Goal 1 (Week 2): STG = LTG due to ELOS  Skilled Therapeutic Interventions/Progress Updates:  Pt greeted supine in bed agreeable to OT intervention. Session focus on family education with pts son and DIL. Pt completed supine>sit with supervision with use of bed features. Pt request to don pants with supervision from EOB. CGA for sit<>stand from EOB with rw. Education provided to pts family on goal of supervision level of assist for Adls and functional transfers. Reiterated importance of providing supervision d/t visual deficits from a safety stand point. Pt transported to tub room with total A for time mgmt, pt completed ambulatory transfer to TTB with CGA with Rw and MIN verbal cues for sequencing, pt exited shower with her DILs assistance. Pt with no grab bars at home, DIL asking about suction grab bars as they live in a rental, education provided on using suction grab bars as only a light balance point and do not recommend pt using suction bar to pull herself up, pt and DIL verbalized understanding as pt can lateral lean to wash buttock from TTB.  Education provided on general fall prevention during showering as well as general fall prevention in the [email or web address] provided on low vision strategies such as increasing light, outlining doorways and changes in terrain with neon tape,and decreasing clutter. Pt transported to apt with total A where pt completed ambulatory transfer to bed however bed in apt too tall for pt and pt unable to step backwards onto step, DIL reports they can remove a mattress at home to ease pts accessibility into bed.  Pt transported back to room with total A where pt was handed off to PT for next session, no further  questions from son and DIL.                               Therapy Documentation Precautions:  Precautions Precautions: Fall Precaution Comments: 2L O2 via [place], droplet precautions, impaired vision due to cataracts Restrictions Weight Bearing Restrictions: No  Pain: no pain reported during session     Therapy/Group: Individual Therapy  Barron Schmid 05/07/2021, 3:26 PM

## 2021-05-08 MED ORDER — AMLODIPINE BESYLATE 2.5 MG PO TABS
2.5000 mg | ORAL_TABLET | Freq: Every day | ORAL | Status: DC
  Administered 2021-05-08 – 2021-05-10 (×3): 2.5 mg via ORAL
  Filled 2021-05-08 (×2): qty 1

## 2021-05-08 NOTE — Discharge Summary (Signed)
Physician Discharge Summary  Patient ID: Brooke Harrington MRN: [id number] DOB/AGE: July 24, 1940 81 y.o.  Admit date: 04/29/2021 Discharge date: 05/10/2021  Discharge Diagnoses:  Principal Problem:   Debility DVT prophylaxis SVT Obesity Pneumonia Hypertension Constipation  Discharged Condition: Stable  Significant Diagnostic Studies: CT Head Wo Contrast  Result Date: 04/23/2021 CLINICAL DATA:  Delirium EXAM: CT HEAD WITHOUT CONTRAST TECHNIQUE: Contiguous axial images were obtained from the base of the skull through the vertex without intravenous contrast. COMPARISON:  07/13/2007 FINDINGS: Brain: There is no mass, hemorrhage or extra-axial collection. There is generalized atrophy without lobar predilection. Hypodensity of the white matter is most commonly associated with chronic microvascular disease. Vascular: No abnormal hyperdensity of the major intracranial arteries or dural venous sinuses. No intracranial atherosclerosis. Skull: The visualized skull base, calvarium and extracranial soft tissues are normal. Sinuses/Orbits: Diffuse mild paranasal sinus mucosal thickening. The orbits are normal. IMPRESSION: Generalized atrophy and chronic microvascular ischemia without acute intracranial abnormality. Electronically Signed   By: Deatra Robinson M.D.   On: 04/23/2021 21:24   MR BRAIN WO CONTRAST  Result Date: 04/24/2021 CLINICAL DATA:  81 year old female with altered mental status. EXAM: MRI HEAD WITHOUT CONTRAST TECHNIQUE: Multiplanar, multiecho pulse sequences of the brain and surrounding structures were obtained without intravenous contrast. COMPARISON:  Head CT 04/23/2021. FINDINGS: The examination had to be discontinued prior to completion due to patient altered mental status. DWI, sagittal T1 and axial T2 imaging only, which is intermittently degraded by motion. Brain: No restricted diffusion to suggest acute infarction. No midline shift, mass effect, evidence of mass lesion,  ventriculomegaly, extra-axial collection or acute intracranial hemorrhage. Cervicomedullary junction and pituitary are within normal limits. Vascular: Major intracranial vascular flow voids are preserved, dominant left vertebral artery suspected. Skull and upper cervical spine: Grossly negative. Sinuses/Orbits: Negative orbits. Widespread paranasal sinus fluid and mucosal thickening redemonstrated. Other: Mastoids are stable and well aerated. Grossly normal visible internal auditory structures. IMPRESSION: 1. Truncated and motion degraded exam. No acute intracranial abnormality. 2. Bilateral paranasal sinus inflammation. Electronically Signed   By: Odessa Fleming M.D.   On: 04/24/2021 10:19   DG CHEST PORT 1 VIEW  Result Date: 04/24/2021 CLINICAL DATA:  Shortness of breath, weakness EXAM: PORTABLE CHEST 1 VIEW COMPARISON:  04/23/2021 FINDINGS: Heart is normal size. Diffuse interstitial prominence throughout the lungs, left greater than right. No effusions. No acute bony abnormality. IMPRESSION: Diffuse interstitial prominence throughout the lungs, left greater than right. This could reflect asymmetric edema or infection. Atypical pneumonia is possible. Electronically Signed   By: Charlett Nose M.D.   On: 04/24/2021 07:47   DG Chest Portable 1 View  Result Date: 04/23/2021 CLINICAL DATA:  Pneumonia. EXAM: PORTABLE CHEST 1 VIEW COMPARISON:  Chest radiograph dated 07/13/2007. FINDINGS: There is diffuse chronic intra coarsening and bronchitic changes. Diffuse interstitial nodularity and areas of airspace density in the left mid to lower lung field concerning for pneumonia. No pleural effusion pneumothorax. The cardiac silhouette is within normal limits. Atherosclerotic calcification of the aorta. No acute osseous pathology. IMPRESSION: Findings concerning for pneumonia in the left mid to lower lung field. Electronically Signed   By: Elgie Collard M.D.   On: 04/23/2021 20:56   ECHOCARDIOGRAM COMPLETE  Result  Date: 04/24/2021    ECHOCARDIOGRAM REPORT   Patient Name:   [name] Date of Exam: 04/24/2021 Medical Rec #:  [id number]      Height:       60.0 in Accession #:    [id number]  Weight:       165.3 lb Date of Birth:  29-May-1940     BSA:          1.722 m Patient Age:    80 years       BP:           111/66 mmHg Patient Gender: F              HR:           91 bpm. Exam Location:  Inpatient Procedure: 2D Echo, Cardiac Doppler and Color Doppler Indications:    Ventricular Tachycardia I47.2  History:        Patient has no prior history of Echocardiogram examinations.                 Risk Factors:Hypertension.  Sonographer:    Celesta Gentile RCS Referring Phys: [phone] ARSHAD N KAKRAKANDY IMPRESSIONS  1. Left ventricular ejection fraction, by estimation, is 70 to 75%. The left ventricle has hyperdynamic function. The left ventricle has no regional wall motion abnormalities. There is mild left ventricular hypertrophy. Left ventricular diastolic parameters are consistent with Grade I diastolic dysfunction (impaired relaxation).  2. Right ventricular systolic function is normal. The right ventricular size is normal. Tricuspid regurgitation signal is inadequate for assessing PA pressure.  3. Left atrial size was upper normal.  4. Thee is a trivial pericardial effusion anterior to the right ventricle.  5. The mitral valve is abnormal. Trivial mitral valve regurgitation.  6. The aortic valve has an indeterminant number of cusps. There is moderate calcification of the aortic valve. Aortic valve regurgitation is mild. Possible mild aortic valve stenosis. Aortic valve mean gradient measures 8.0 mmHg. Dimenionless index 0.62.  7. The inferior vena cava is normal in size with greater than 50% respiratory variability, suggesting right atrial pressure of 3 mmHg. Comparison(s): No prior Echocardiogram. FINDINGS  Left Ventricle: Left ventricular ejection fraction, by estimation, is 70 to 75%. The left ventricle has hyperdynamic  function. The left ventricle has no regional wall motion abnormalities. The left ventricular internal cavity size was normal in size. There is mild left ventricular hypertrophy. Left ventricular diastolic parameters are consistent with Grade I diastolic dysfunction (impaired relaxation). Right Ventricle: The right ventricular size is normal. No increase in right ventricular wall thickness. Right ventricular systolic function is normal. Tricuspid regurgitation signal is inadequate for assessing PA pressure. Left Atrium: Left atrial size was upper normal. Right Atrium: Right atrial size was normal in size. Pericardium: Trivial pericardial effusion is present. The pericardial effusion is anterior to the right ventricle. Presence of epicardial fat layer. Mitral Valve: The mitral valve is abnormal. Mild mitral annular calcification. Trivial mitral valve regurgitation. Tricuspid Valve: The tricuspid valve is grossly normal. Tricuspid valve regurgitation is trivial. Aortic Valve: The aortic valve has an indeterminant number of cusps. There is moderate calcification of the aortic valve. Aortic valve regurgitation is mild. Mild aortic stenosis is present. Aortic valve mean gradient measures 8.0 mmHg. Aortic valve peak  gradient measures 16.6 mmHg. Aortic valve area, by VTI measures 1.76 cm. Pulmonic Valve: The pulmonic valve was not well visualized. Pulmonic valve regurgitation is trivial. Aorta: The aortic root is normal in size and structure. Venous: The inferior vena cava is normal in size with greater than 50% respiratory variability, suggesting right atrial pressure of 3 mmHg. IAS/Shunts: The interatrial septum appears to be lipomatous. No atrial level shunt detected by color flow Doppler.  LEFT VENTRICLE PLAX 2D LVIDd:  3.90 cm   Diastology LVIDs:         2.80 cm   LV e' medial:    5.44 cm/s LV PW:         1.10 cm   LV E/e' medial:  19.1 LV IVS:        1.20 cm   LV e' lateral:   4.68 cm/s LVOT diam:     1.90 cm    LV E/e' lateral: 22.2 LV SV:         71 LV SV Index:   41 LVOT Area:     2.84 cm  RIGHT VENTRICLE RV S prime:     12.80 cm/s TAPSE (M-mode): 2.0 cm LEFT ATRIUM             Index        RIGHT ATRIUM           Index LA diam:        4.10 cm 2.38 cm/m   RA Area:     13.40 cm LA Vol (A2C):   66.0 ml 38.34 ml/m  RA Volume:   30.90 ml  17.95 ml/m LA Vol (A4C):   43.3 ml 25.15 ml/m LA Biplane Vol: 53.9 ml 31.31 ml/m  AORTIC VALVE AV Area (Vmax):    1.85 cm AV Area (Vmean):   2.03 cm AV Area (VTI):     1.76 cm AV Vmax:           203.50 cm/s AV Vmean:          132.500 cm/s AV VTI:            0.402 m AV Peak Grad:      16.6 mmHg AV Mean Grad:      8.0 mmHg LVOT Vmax:         133.00 cm/s LVOT Vmean:        95.100 cm/s LVOT VTI:          0.250 m LVOT/AV VTI ratio: 0.62  AORTA Ao Root diam: 3.00 cm MITRAL VALVE MV Area (PHT): 2.20 cm     SHUNTS MV Decel Time: 345 msec     Systemic VTI:  0.25 m MV E velocity: 104.00 cm/s  Systemic Diam: 1.90 cm MV A velocity: 132.00 cm/s MV E/A ratio:  0.79 Nona DellSamuel Mcdowell MD Electronically signed by Nona DellSamuel Mcdowell MD Signature Date/Time: 04/24/2021/5:41:26 PM    Final     Labs:  Basic Metabolic Panel: Recent Labs  Lab 05/06/21 0551  CREATININE 0.57    CBC: No results for input(s): WBC, NEUTROABS, HGB, HCT, MCV, PLT in the last 168 hours.   CBG: No results for input(s): GLUCAP in the last 168 hours.  Family history.  Positive for hypertension as well as hyperlipidemia.  Denies any colon cancer esophageal cancer or rectal cancer  Brief HPI:   Ivor MessierRusty C Gotcher is a 81 y.o. right-handed female with history of hypertension as well as hyperlipidemia.  Per chart review lives with her children.  Modified independent prior to admission.  Presented 04/23/2021 with increasing cough x2 days and mild slurring of speech.  CT/MRI showed no acute intracranial abnormality.  Chest x-ray concerning for pneumonia to left mid to lower lung field.  Admission chemistries sodium 132  potassium 3.0 glucose 125 BUN 54 creatinine 1.04 lactic acid 1.9 WBC 12,000 blood cultures haemophilus influenza detected, troponin 452-416.  Patient was placed on Tamiflu.  There was concern for secondary bacterial infection due to many days of symptoms placed  on ceftriaxone as well as doxycycline changed to Augmentin 04/29/2021.  Echocardiogram ejection fraction of 70 to 75% no wall motion abnormalities grade 1 diastolic dysfunction.  Lovenox added for DVT prophylaxis.  Hospital course bouts of SVT with rate 203 systolic blood pressure in the 70s she did initially receive IV adenosine with successful cardioversion and currently remain on Lopressor.  Tolerating a regular diet.  Therapy evaluations completed due to patient decreased functional mobility was admitted for a comprehensive rehab program.   Hospital Course: KYNLI CHOU was admitted to rehab 04/29/2021 for inpatient therapies to consist of PT, ST and OT at least three hours five days a week. Past admission physiatrist, therapy team and rehab RN have worked together to provide customized collaborative inpatient rehab.  Pertaining to patient's debility related to pneumonia/acute respiratory failure resolving metabolic encephalopathy she continued to progress nicely in therapies.  Lovenox for DVT prophylaxis.  She also was maintained on low-dose aspirin.  Patient completed course of Tamiflu and Augmentin for pneumonia oxygen saturations maintained.  Blood pressure controlled adjustments made and Lopressor as needed she would need outpatient follow-up.  Bouts of constipation resolved with laxative assistance.   Blood pressures were monitored on TID basis and controlled    Rehab course: During patient's stay in rehab weekly team conferences were held to monitor patient's progress, set goals and discuss barriers to discharge. At admission, patient required moderate assist stand pivot transfers minimal assist sit to stand  Physical exam.  Blood  pressure 132/53 pulse 84 temperature 98.4 respirations 18 oxygen saturation 94% room air Constitutional.  No acute distress HEENT Head.  Normocephalic and atraumatic Eyes.  Pupils round and reactive to light no discharge without nystagmus Neck.  Supple nontender no JVD without thyromegaly Cardiac regular rate and rhythm any extra sounds or murmur heard Abdomen.  Soft nontender positive bowel sounds without rebound Respiratory effort normal no respiratory distress without wheeze Skin.  Chronic changes lower extremities, mild Neurologic.  Alert oriented x3 some delay in processing.  Speech dysarthric low volume.  Upper extremity motor 4/5, 3/5 proximal to 4/5 distally.  No focal sensory abnormalities  He/She  has had improvement in activity tolerance, balance, postural control as well as ability to compensate for deficits. He/She has had improvement in functional use RUE/LUE  and RLE/LLE as well as improvement in awareness.  Perform multiple sit to stand transfers contact-guard.  Negotiated 4 inch platform simulating home set up front door contact-guard.  Ambulates 150 feet with family members.  Completed supine to sit supervision with use of bed features.  Gather his belongings for activities day living homemaking.  Full family teaching completed plan discharged to home.  Patient able to form SUV height contact-guard transfer.       Disposition: Discharged to home    Diet: Mechanical soft  Special Instructions: No driving smoking or alcohol  Medications at discharge 1.  Tylenol as needed 2.  Aspirin 81 mg p.o. daily 3.  Mucinex 600 mg BID 4.  Lopressor 62.5 mg p.o. twice daily 5.  Multivitamin daily 6.  Senokot S2 tablets after supper 7.Imodium 2mg  as needed 8.Norvasc 2.5 mg daily  30-35 minutes were spent completing discharge summary and discharge planning  Discharge Instructions     Ambulatory referral to Physical Medicine Rehab   Complete by: As directed    Moderate  complexity follow-up 1 to 2 weeks debility/resolving metabolic encephalopathy        Follow-up Information     Raulkar, , MD  Follow up.   Specialty: Physical Medicine and Rehabilitation Why: 06/13/21 please arrive at 11:00 for 11:20 appointment, thank you! Contact information: [address] [place] [address] [phone]                 Signed: Charlton AmorDaniel J Laquinton Bihm 05/10/2021, 5:22 AM

## 2021-05-08 NOTE — Progress Notes (Signed)
PROGRESS NOTE   Subjective/Complaints:  Pt reports needs to pee- No issues.    ROS:   Pt denies SOB, abd pain, CP, N/V/C/D, and vision changes  Objective:   No results found. No results for input(s): WBC, HGB, HCT, PLT in the last 72 hours.  Recent Labs    05/06/21 0551  CREATININE 0.57     Intake/Output Summary (Last 24 hours) at 05/08/2021 1259 Last data filed at 05/07/2021 1748 Gross per 24 hour  Intake 236 ml  Output --  Net 236 ml     Pressure Injury 04/29/21 Buttocks Left Stage 2 -  Partial thickness loss of dermis presenting as a shallow open injury with a red, pink wound bed without slough. healing s1 blanchable as of 12/30 (Active)  04/29/21 1540  Location: Buttocks  Location Orientation: Left  Staging: Stage 2 -  Partial thickness loss of dermis presenting as a shallow open injury with a red, pink wound bed without slough.  Wound Description (Comments): healing s1 blanchable as of 12/30  Present on Admission: Yes     Pressure Injury 04/29/21 Buttocks Right Stage 2 -  Partial thickness loss of dermis presenting as a shallow open injury with a red, pink wound bed without slough. healing s2, blanchable as of 12/30 (Active)  04/29/21 1541  Location: Buttocks  Location Orientation: Right  Staging: Stage 2 -  Partial thickness loss of dermis presenting as a shallow open injury with a red, pink wound bed without slough.  Wound Description (Comments): healing s2, blanchable as of 12/30  Present on Admission: Yes    Physical Exam: Vital Signs Blood pressure (!) 188/58, pulse 78, temperature 97.7 F (36.5 C), temperature source Oral, resp. rate 16, height 5' (1.524 m), weight 75 kg, SpO2 97 %.     General: awake, alert, appropriate, sitting up in bed; waiting for nursing; NAD HENT: conjugate gaze; oropharynx moist; thick eyeglass lenses CV: regular rate; no JVD Pulmonary: CTA B/L; no W/R/R- good air  movement GI: soft, NT, ND, (+)BS Psychiatric: appropriate Neurological: alert  Musculoskeletal:     Right lower leg: Edema present.     Left lower leg: Edema present.  Skin:    General: Skin is warm. Groin very red/raw appearing B/L- but hard ot assess due to a lot of cream in place- buttocks similar- looks like MASD    Comments: Chronic changes in LE's, mild.   Neurological:     Comments: Patient is alert.  Impaired reasoning and recall. Sitting up in chair.  Oriented x3 and follows commands. Some delays in processing. Speech dysarthric/low volume. Able to provide biographical information. UE Motor 4/5. LE: 3/5 prox to 4/5 distally. No focal sensory abnl. No limb ataxia  Psychiatric:     Comments: More animated and expressive GU: on commode, blood per rectum   Assessment/Plan: 1. Functional deficits which require 3+ hours per day of interdisciplinary therapy in a comprehensive inpatient rehab setting. Physiatrist is providing close team supervision and 24 hour management of active medical problems listed below. Physiatrist and rehab team continue to assess barriers to discharge/monitor patient progress toward functional and medical goals  Care Tool:  Bathing  Body parts bathed by patient: Right arm, Left arm, Chest, Abdomen, Right upper leg, Left upper leg, Right lower leg, Left lower leg, Face         Bathing assist Assist Level: Supervision/Verbal cueing     Upper Body Dressing/Undressing Upper body dressing   What is the patient wearing?: Pull over shirt    Upper body assist Assist Level: Set up assist    Lower Body Dressing/Undressing Lower body dressing      What is the patient wearing?: Pants     Lower body assist Assist for lower body dressing: Supervision/Verbal cueing     Toileting Toileting    Toileting assist Assist for toileting: Contact Guard/Touching assist     Transfers Chair/bed transfer  Transfers assist     Chair/bed transfer assist  level: Contact Guard/Touching assist     Locomotion Ambulation   Ambulation assist      Assist level: Contact Guard/Touching assist Assistive device: Walker-rolling Max distance: 160   Walk 10 feet activity   Assist  Walk 10 feet activity did not occur: Safety/medical concerns  Assist level: Contact Guard/Touching assist Assistive device: Walker-rolling   Walk 50 feet activity   Assist Walk 50 feet with 2 turns activity did not occur: Safety/medical concerns  Assist level: Contact Guard/Touching assist Assistive device: Walker-rolling    Walk 150 feet activity   Assist Walk 150 feet activity did not occur: Safety/medical concerns  Assist level: Contact Guard/Touching assist Assistive device: Walker-rolling    Walk 10 feet on uneven surface  activity   Assist Walk 10 feet on uneven surfaces activity did not occur: Safety/medical concerns         Wheelchair     Assist Is the patient using a wheelchair?: Yes Type of Wheelchair: Manual Wheelchair activity did not occur: Safety/medical concerns         Wheelchair 50 feet with 2 turns activity    Assist    Wheelchair 50 feet with 2 turns activity did not occur: Safety/medical concerns       Wheelchair 150 feet activity     Assist  Wheelchair 150 feet activity did not occur: Safety/medical concerns       Blood pressure (!) 188/58, pulse 78, temperature 97.7 F (36.5 C), temperature source Oral, resp. rate 16, height 5' (1.524 m), weight 75 kg, SpO2 97 %.  Medical Problem List and Plan: 1. Functional deficits. debility secondary to pneumonia/acute respiratory failure.  Pt also with resolving metabolic encephalopathy             -patient may shower             -ELOS/Goals: 7-10 days/ mod I for basic mobility and self-care  Continue CIR- PT, OT   HFU scheduled 2.  Impaired mobility: Continue Lovenox             -antiplatelet therapy: Aspirin 81 mg daily 3. Pain Management:  Tylenol as needed 4. Mood: con't emotional support             -antipsychotic agents: N/A 5. Neuropsych: This patient is capable of making decisions on her own behalf. 6. Skin/Wound Care: Routine skin checks 7. Fluids/Electrolytes/Nutrition: encourage PO             -pt is edentulous. I asked her if she would like chopped food and she said yes--->ordered D3 diet to start 8.  SVT.  Patient did initially require IV adenosine successful cardioversion.  Continue Lopressor 50 mg twice daily. Cardiology services follow-up             -  monitor HR with activities 9.  Obesity BMI 32.29: provide dietary education  1/8- needs to have weight redone 10. Pneumonia: complete course of tamiflu and augmentin             -weaned O2             -IS, OOB  Start robitussin DM q4H prn.  11. Cognitive impairment: placed nursing order to request assistance with meals and meals out of bed in chair 12. Diarrhea: continue imodium.  13. MASD in groin/buttocks: continue nystatin cream.  14. Hypertension: creatinine stable. Increase lopressor to 62.5mg  BID.   1/8- BP elevated to 188 systolic this AM- will add Norvasc 2.5 mg daily 15. Impaired reasoning and recall: has reached baseline, d/c SLP 16. Constipation: add milk of magnesium daily prn. Blood per rectum likely secondary to straining, increase milk of mag to 34mL.   1/8- going to bathroom in a few minutes-   LOS: 9 days A FACE TO FACE EVALUATION WAS PERFORMED  Kmari Halter 05/08/2021, 12:59 PM

## 2021-05-09 ENCOUNTER — Other Ambulatory Visit (HOSPITAL_COMMUNITY): Payer: Self-pay

## 2021-05-09 ENCOUNTER — Other Ambulatory Visit: Payer: Self-pay

## 2021-05-09 MED ORDER — WITCH HAZEL-GLYCERIN EX PADS
MEDICATED_PAD | CUTANEOUS | Status: DC | PRN
  Filled 2021-05-09: qty 100

## 2021-05-09 MED ORDER — GUAIFENESIN ER 600 MG PO TB12
600.0000 mg | ORAL_TABLET | Freq: Two times a day (BID) | ORAL | 0 refills | Status: AC
Start: 2021-05-09 — End: ?
  Filled 2021-05-09: qty 60, 30d supply, fill #0

## 2021-05-09 MED ORDER — ACETAMINOPHEN 325 MG PO TABS: 650.0000 mg | ORAL_TABLET | Freq: Four times a day (QID) | ORAL | Status: AC | PRN

## 2021-05-09 MED ORDER — AMLODIPINE BESYLATE 2.5 MG PO TABS
2.5000 mg | ORAL_TABLET | Freq: Every day | ORAL | 0 refills | Status: AC
Start: 2021-05-10 — End: ?
  Filled 2021-05-09: qty 30, 30d supply, fill #0

## 2021-05-09 MED ORDER — METOPROLOL TARTRATE 25 MG PO TABS
62.5000 mg | ORAL_TABLET | Freq: Two times a day (BID) | ORAL | 0 refills | Status: DC
  Filled 2021-05-09: qty 150, 30d supply, fill #0

## 2021-05-09 MED ORDER — LOPERAMIDE HCL 2 MG PO CAPS: 2.0000 mg | ORAL_CAPSULE | ORAL | 0 refills | Status: AC | PRN

## 2021-05-09 MED ORDER — ADULT MULTIVITAMIN W/MINERALS CH: 1.0000 | ORAL_TABLET | Freq: Every day | ORAL | Status: AC

## 2021-05-09 MED ORDER — GUAIFENESIN ER 600 MG PO TB12
600.0000 mg | ORAL_TABLET | Freq: Every day | ORAL | Status: DC
  Administered 2021-05-10: 600 mg via ORAL
  Filled 2021-05-09: qty 1

## 2021-05-09 NOTE — Progress Notes (Signed)
Patient ID: Brooke Harrington, female   DOB: 05-02-1940, 81 y.o.   MRN: [id number]   Pt family informed of [employer] attempts to contact. Contact information provided to family.

## 2021-05-09 NOTE — Progress Notes (Signed)
Physical Therapy Discharge Summary  Patient Details  Name: Brooke Harrington MRN: [id number] Date of Birth: July 17, 1940  Today's Date: 05/09/2021 PT Individual Time: 5208-0223 PT Individual Time Calculation (min): 69 min   Patient has met 6 of 9 long term goals due to improved activity tolerance, improved balance, improved postural control, increased strength, ability to compensate for deficits, improved attention, improved awareness, and improved coordination. Patient to discharge at an ambulatory level Supervision using RW. Patient's care partner is independent to provide the necessary physical and cognitive assistance at discharge. Pt's family attended family education training on 1/7 and verbalized and demonstrated confidence with all tasks to ensure safe discharge home.   Reasons goals not met: Pt did not meet mod I bed mobility and transfer goals, as pt continues to require supervision due to visual deficits, generalized weakness, and decreased balance strategies.   Recommendation:  Patient will benefit from ongoing skilled PT services in home health setting to continue to advance safe functional mobility, address ongoing impairments in generalized strengthening, dynamic standing balance/coordination, gait training, endurance, and to minimize fall risk.  Equipment: RW  Reasons for discharge: treatment goals met  Patient/family agrees with progress made and goals achieved: Yes  Today's Interventions: Received pt sitting in recliner, pt agreeable to PT treatment, and denied any pain during session. Session with emphasis on discharge planning, functional mobility/transfers, generalized strengthening, dynamic standing balance/coordination, gait training, stair navigation, simulated car transfers, and improved activity tolerance. Pt performed all transfers with RW and close supervision throughout session. [facility] 55f [facility]. Pt performed ambulatory simulated car transfer with RW and close supervision - required use of BUEs to get LEs into car. Pt then ambulated 140fon uneven surfaces (ramp) with RW and close supervision. Pt unable to pick up item from floor due to visual deficits and pt's inability to see tape roll placed on ground. Pt then navigated 4 steps with 2 rails and CGA ascending and descending with a step to pattern. Pt transported to dayroom and ambulated 18041fith RW and close supervision. Upon approaching Nustep pt unable to see it requiring assist from therapist to steer RW and guide pt to seat. Pt performed BUE/LE strengthening on workload 4 for 10 minutes for a total of 438 steps with emphasis on cardiovascular endurance with 2 rest breaks. Stand<>pivot Nustep<>WC with RW and supervision and provided pt with HEP and educated on frequency/duration/technique for the following exercises: -Mini Squat with Counter Support - 1 x daily - 7 x weekly - 3 sets - 10 reps -Standing Knee Flexion with Counter Support - 1 x daily - 7 x weekly - 3 sets - 10 reps -Standing March with Unilateral Counter Support - 1 x daily - 7 x weekly - 3 sets - 10 reps -Standing Hip Abduction with Unilateral Counter Support - 1 x daily - 7 x weekly - 3 sets - 10 reps -Standing Heel Raises - 1 x daily - 7 x weekly - 3 sets - 10 reps Emphasized importance of using RW upon D/C for safety purposes and due to visual deficits and pt verbalized understanding. Pt requested to return to bed and transferred WC<>bed stand<>pivot with RW and supervision and sit<>semi-reclined with supervision. Concluded session with pt semi-reclined in bed with all needs within reach.   O2 sat >93% on RA throughout session.   PT Discharge Precautions/Restrictions Precautions Precautions: Fall Precaution Comments: impaired  vision due to cataracts Restrictions Weight Bearing Restrictions: No Pain Interference Pain  Interference Pain Effect on Sleep: 1. Rarely or not at all Pain Interference with Therapy Activities: 0. Does not apply - I have not received rehabilitationtherapy in the past 5 days Pain Interference with Day-to-Day Activities: 1. Rarely or not at all Cognition Overall Cognitive Status: History of cognitive impairments - at baseline Arousal/Alertness: Awake/alert Orientation Level: Oriented X4 Memory: Impaired Awareness: Impaired Problem Solving: Impaired Safety/Judgment: Impaired Sensation Sensation Light Touch: Appears Intact Proprioception: Appears Intact Coordination Gross Motor Movements are Fluid and Coordinated: Yes (generalized weakness/deconditioning, visual deficits) Fine Motor Movements are Fluid and Coordinated: No Finger Nose Finger Test: Mild dysmetria bilaterally Heel Shin Test: WFL bilaterally Motor  Motor Motor: Within Functional Limits Motor - Skilled Clinical Observations: generalized weakness/deconditioning, visual deficits  Mobility Bed Mobility Bed Mobility: Rolling Right;Rolling Left;Sit to Supine;Supine to Sit Rolling Right: Supervision/verbal cueing Rolling Left: Supervision/Verbal cueing Supine to Sit: Supervision/Verbal cueing Sit to Supine: Supervision/Verbal cueing Transfers Transfers: Sit to Stand;Stand to Sit;Stand Pivot Transfers Sit to Stand: Supervision/Verbal cueing Stand to Sit: Supervision/Verbal cueing Stand Pivot Transfers: Supervision/Verbal cueing Transfer (Assistive device): Rolling walker Locomotion  Gait Ambulation: Yes Gait Assistance: Supervision/Verbal cueing Gait Distance (Feet): 180 Feet Assistive device: Rolling walker Gait Assistance Details: Verbal cues for technique Gait Assistance Details: verbal cues for direction Gait Gait: Yes Gait Pattern: Impaired Gait Pattern: Step-through pattern;Decreased step length - right;Decreased step length - left;Decreased stride length;Trunk flexed;Poor foot clearance - left;Poor  foot clearance - right;Narrow base of support Gait velocity: decreased Stairs / Additional Locomotion Stairs: Yes Stairs Assistance: Contact Guard/Touching assist Stair Management Technique: Two rails Number of Stairs: 4 Height of Stairs: 6 Ramp: Supervision/Verbal cueing (RW) Pick up small object from the floor assist level: Dependent - Patient 0% Wheelchair Mobility Wheelchair Mobility: No  Trunk/Postural Assessment  Cervical Assessment Cervical Assessment: Exceptions to [facility] (forward head) Thoracic Assessment Thoracic Assessment: Exceptions to [facility] (mild kyphosis) Lumbar Assessment Lumbar Assessment: Exceptions to [facility] (posterior pelvic tilt) Postural Control Postural Control: Within Functional Limits  Balance Balance Balance Assessed: Yes Static Sitting Balance Static Sitting - Balance Support: Feet supported;No upper extremity supported Static Sitting - Level of Assistance: 6: Modified independent (Device/Increase time) Dynamic Sitting Balance Dynamic Sitting - Balance Support: Feet supported;Bilateral upper extremity supported Dynamic Sitting - Level of Assistance: 6: Modified independent (Device/Increase time) Static Standing Balance Static Standing - Balance Support: Bilateral upper extremity supported;During functional activity (RW) Static Standing - Level of Assistance: 5: Stand by assistance (supervision) Dynamic Standing Balance Dynamic Standing - Balance Support: Bilateral upper extremity supported;During functional activity (RW) Dynamic Standing - Level of Assistance: 5: Stand by assistance (supervision) Extremity Assessment  RLE Assessment RLE Assessment: Exceptions to [facility] RLE Strength Right Hip Flexion: 4-/5 Right Hip ABduction: 3+/5 Right Hip ADduction: 3+/5 Right Knee Flexion: 3+/5 Right Knee Extension: 3+/5 Right Ankle Dorsiflexion: 3+/5 Right Ankle Plantar Flexion: 3+/5 LLE Assessment LLE Assessment: Exceptions to [facility] LLE Strength Left Hip Flexion:  4-/5 Left Hip ABduction: 3+/5 Left Hip ADduction: 3+/5 Left Knee Flexion: 3+/5 Left Knee Extension: 4-/5 Left Ankle Dorsiflexion: 3+/5 Left Ankle Plantar Flexion: 3+/5  Alfonse Alpers PT, DPT  05/09/2021, 7:39 AM

## 2021-05-09 NOTE — Progress Notes (Signed)
Occupational Therapy Discharge Summary  Patient Details  Name: Brooke Harrington MRN: [id number] Date of Birth: 1940/07/01   Patient has met 9 of 9 long term goals due to improved activity tolerance, improved balance, postural control, ability to compensate for deficits, improved attention, improved awareness, and improved coordination.  Patient to discharge at overall Supervision level.  Patient's care partner is independent to provide the necessary supervision assistance at discharge.    Reasons goals not met: n/a  Recommendation:  Patient will benefit from ongoing skilled OT services in home health setting to continue to advance functional skills in the area of BADL, iADL, and Reduce care partner burden.  Equipment: 3 in 1 BSC and TTB  Reasons for discharge: treatment goals met  Patient/family agrees with progress made and goals achieved: Yes  OT Discharge Precautions/Restrictions  Precautions Precautions: Fall Precaution Comments: impaired vision due to cataracts Restrictions Weight Bearing Restrictions: No ADL ADL Eating: Set up Where Assessed-Eating: Edge of bed Grooming: Setup Where Assessed-Grooming: Edge of bed Upper Body Bathing: Setup Where Assessed-Upper Body Bathing: Edge of bed Lower Body Bathing: Supervision/safety Where Assessed-Lower Body Bathing: Edge of bed Upper Body Dressing: Setup Where Assessed-Upper Body Dressing: Edge of bed Lower Body Dressing: Supervision/safety Where Assessed-Lower Body Dressing: Edge of bed Toileting: Supervision/safety Where Assessed-Toileting: Glass blower/designer: Close supervision Toilet Transfer Method: Ambulating (RW) Science writer: Energy manager: Close supervison Clinical cytogeneticist Method: Educational psychologist: Radio broadcast assistant, Energy manager: Close supervision Social research officer, government Method: Management consultant: Radio broadcast assistant, Grab  bars Vision Baseline Vision/History: 1 Wears glasses;4 Cataracts Patient Visual Report: No change from baseline Vision Assessment?: Vision impaired- to be further tested in functional context Ocular Range of Motion: Within Functional Limits Alignment/Gaze Preference: Within Defined Limits Tracking/Visual Pursuits: Decreased smoothness of vertical tracking;Decreased smoothness of horizontal tracking Saccades: Decreased speed of saccadic movement;Overshoots Visual Fields: No apparent deficits Additional Comments: catarcts, blurry (reports only seeing outlines), compensatory scanning method vs visual acuity during vision assessment Perception  Perception: Within Functional Limits Praxis Praxis: Intact Cognition Overall Cognitive Status: History of cognitive impairments - at baseline Arousal/Alertness: Lethargic Orientation Level: Oriented X4 Year: 2023 Month: January Day of Week: Correct Memory: Impaired Immediate Memory Recall: Sock;Blue;Bed Memory Recall Sock: Without Cue Memory Recall Blue: Without Cue Memory Recall Bed: Without Cue Awareness: Impaired Problem Solving: Impaired Safety/Judgment: Impaired Sensation Sensation Light Touch: Appears Intact Coordination Gross Motor Movements are Fluid and Coordinated: Yes Fine Motor Movements are Fluid and Coordinated: No Finger Nose Finger Test: Mild dysmetria bilaterally Motor  Motor Motor: Within Functional Limits Motor - Skilled Clinical Observations: generalized weakness/deconditioning, visual deficits Mobility  Bed Mobility Bed Mobility: Rolling Right;Rolling Left;Sit to Supine;Supine to Sit Rolling Right: Supervision/verbal cueing Rolling Left: Supervision/Verbal cueing Supine to Sit: Supervision/Verbal cueing Sit to Supine: Supervision/Verbal cueing Transfers Sit to Stand: Supervision/Verbal cueing Stand to Sit: Supervision/Verbal cueing  Trunk/Postural Assessment  Cervical Assessment Cervical Assessment:  Exceptions to [facility] (forward head) Thoracic Assessment Thoracic Assessment: Exceptions to [facility] (mild kyphosis) Lumbar Assessment Lumbar Assessment: Exceptions to [facility] (posterior pelvic tilt) Postural Control Postural Control: Within Functional Limits  Balance Balance Balance Assessed: Yes Static Sitting Balance Static Sitting - Balance Support: Feet supported;No upper extremity supported Static Sitting - Level of Assistance: 6: Modified independent (Device/Increase time) Dynamic Sitting Balance Dynamic Sitting - Balance Support: Feet supported;Bilateral upper extremity supported Dynamic Sitting - Level of Assistance: 6: Modified independent (Device/Increase time) Static Standing Balance Static Standing - Balance  Support: Bilateral upper extremity supported;During functional activity (RW) Static Standing - Level of Assistance: 5: Stand by assistance (supervision) Dynamic Standing Balance Dynamic Standing - Balance Support: Bilateral upper extremity supported;During functional activity (RW) Dynamic Standing - Level of Assistance: 5: Stand by assistance (supervision) Extremity/Trunk Assessment RUE Assessment RUE Assessment: Within Functional Limits Active Range of Motion (AROM) Comments: WNL LUE Assessment LUE Assessment: Within Functional Limits Active Range of Motion (AROM) Comments: WNL   Brooke Harrington E Brooke Harrington 05/09/2021, 8:17 AM

## 2021-05-09 NOTE — Progress Notes (Signed)
PROGRESS NOTE   Subjective/Complaints: Complains of hemorrhoidal pain- witch hazel pads ordered Now moving bowels more regularly Does not have an incentive spirometer- will order Prefers honey to cough medicine  ROS:   Pt denies SOB, abd pain, CP, N/V/C/D, and vision changes, cough just a tickle now  Objective:   No results found. No results for input(s): WBC, HGB, HCT, PLT in the last 72 hours.  No results for input(s): NA, K, CL, CO2, GLUCOSE, BUN, CREATININE, CALCIUM in the last 72 hours.    Intake/Output Summary (Last 24 hours) at 05/09/2021 1224 Last data filed at 05/09/2021 0800 Gross per 24 hour  Intake 712 ml  Output --  Net 712 ml     Pressure Injury 04/29/21 Buttocks Left Stage 2 -  Partial thickness loss of dermis presenting as a shallow open injury with a red, pink wound bed without slough. healing s1 blanchable as of 12/30 (Active)  04/29/21 1540  Location: Buttocks  Location Orientation: Left  Staging: Stage 2 -  Partial thickness loss of dermis presenting as a shallow open injury with a red, pink wound bed without slough.  Wound Description (Comments): healing s1 blanchable as of 12/30  Present on Admission: Yes     Pressure Injury 04/29/21 Buttocks Right Stage 2 -  Partial thickness loss of dermis presenting as a shallow open injury with a red, pink wound bed without slough. healing s2, blanchable as of 12/30 (Active)  04/29/21 1541  Location: Buttocks  Location Orientation: Right  Staging: Stage 2 -  Partial thickness loss of dermis presenting as a shallow open injury with a red, pink wound bed without slough.  Wound Description (Comments): healing s2, blanchable as of 12/30  Present on Admission: Yes    Physical Exam: Vital Signs Blood pressure (!) 185/58, pulse 80, temperature 97.8 F (36.6 C), resp. rate 18, height 5' (1.524 m), weight 75 kg, SpO2 97 %.  Gen: no distress, normal  appearing HEENT: oral mucosa pink and moist, NCAT Cardio: Reg rate Chest: normal effort, normal rate of breathing Abd: soft, non-distended Ext: no edema Psych: pleasant, normal affect  Musculoskeletal:     Right lower leg: Edema present.     Left lower leg: Edema present.  Skin:    General: Skin is warm. Groin very red/raw appearing B/L- but hard ot assess due to a lot of cream in place- buttocks similar- looks like MASD    Comments: Chronic changes in LE's, mild.   Neurological:     Comments: Patient is alert.  Impaired reasoning and recall. Sitting up in chair.  Oriented x3 and follows commands. Some delays in processing. Speech dysarthric/low volume. Able to provide biographical information. UE Motor 4/5. LE: 3/5 prox to 4/5 distally. No focal sensory abnl. No limb ataxia  Psychiatric:     Comments: More animated and expressive GU: on commode, blood per rectum   Assessment/Plan: 1. Functional deficits which require 3+ hours per day of interdisciplinary therapy in a comprehensive inpatient rehab setting. Physiatrist is providing close team supervision and 24 hour management of active medical problems listed below. Physiatrist and rehab team continue to assess barriers to discharge/monitor patient progress toward functional  and medical goals  Care Tool:  Bathing    Body parts bathed by patient: Right arm, Left arm, Chest, Abdomen, Right upper leg, Left upper leg, Right lower leg, Left lower leg, Face         Bathing assist Assist Level: Supervision/Verbal cueing     Upper Body Dressing/Undressing Upper body dressing   What is the patient wearing?: Pull over shirt    Upper body assist Assist Level: Set up assist    Lower Body Dressing/Undressing Lower body dressing      What is the patient wearing?: Pants     Lower body assist Assist for lower body dressing: Supervision/Verbal cueing     Toileting Toileting    Toileting assist Assist for toileting: Contact  Guard/Touching assist     Transfers Chair/bed transfer  Transfers assist     Chair/bed transfer assist level: Supervision/Verbal cueing     Locomotion Ambulation   Ambulation assist      Assist level: Supervision/Verbal cueing Assistive device: Walker-rolling Max distance: [id number]   Walk 10 feet activity   Assist  Walk 10 feet activity did not occur: Safety/medical concerns  Assist level: Supervision/Verbal cueing Assistive device: Walker-rolling   Walk 50 feet activity   Assist Walk 50 feet with 2 turns activity did not occur: Safety/medical concerns  Assist level: Supervision/Verbal cueing Assistive device: Walker-rolling    Walk 150 feet activity   Assist Walk 150 feet activity did not occur: Safety/medical concerns  Assist level: Supervision/Verbal cueing Assistive device: Walker-rolling    Walk 10 feet on uneven surface  activity   Assist Walk 10 feet on uneven surfaces activity did not occur: Safety/medical concerns   Assist level: Supervision/Verbal cueing Assistive device: Walker-rolling   Wheelchair     Assist Is the patient using a wheelchair?: Yes Type of Wheelchair: Manual Wheelchair activity did not occur: N/A         Wheelchair 50 feet with 2 turns activity    Assist    Wheelchair 50 feet with 2 turns activity did not occur: N/A       Wheelchair 150 feet activity     Assist  Wheelchair 150 feet activity did not occur: N/A       Blood pressure (!) 185/58, pulse 80, temperature 97.8 F (36.6 C), resp. rate 18, height 5' (1.524 m), weight 75 kg, SpO2 97 %.  Medical Problem List and Plan: 1. Functional deficits. debility secondary to pneumonia/acute respiratory failure.  Pt also with resolving metabolic encephalopathy             -patient may shower             -ELOS/Goals: 7-10 days/ mod I for basic mobility and self-care  Continue CIR- PT, OT   HFU scheduled 2.  Impaired mobility: Continue Lovenox              -antiplatelet therapy: Aspirin 81 mg daily 3. Pain Management: Tylenol as needed 4. Mood: con't emotional support             -antipsychotic agents: N/A 5. Neuropsych: This patient is capable of making decisions on her own behalf. 6. Skin/Wound Care: Routine skin checks 7. Fluids/Electrolytes/Nutrition: encourage PO             -pt is edentulous. I asked her if she would like chopped food and she said yes--->ordered D3 diet to start 8.  SVT.  Patient did initially require IV adenosine successful cardioversion.  Continue Lopressor 50 mg twice daily.  Cardiology services follow-up             -monitor HR with activities 9.  Obesity BMI 32.29: provide dietary education  1/8- needs to have weight redone 10. Pneumonia: complete course of tamiflu and augmentin             -weaned O2             -IS, OOB  Start robitussin DM q4H prn.   Decrease mucinex to daily.  11. Cognitive impairment: placed nursing order to request assistance with meals and meals out of bed in chair 12. Diarrhea: continue imodium.  13. MASD in groin/buttocks: continue nystatin cream.  14. Hypertension: creatinine stable. Increase lopressor to 62.5mg  BID.   1/8- BP elevated to 188 systolic this AM- will add Norvasc 2.5 mg daily 15. Impaired reasoning and recall: has reached baseline, d/c SLP 16. Constipation: continue milk of magnesium daily prn. Blood per rectum likely secondary to straining, increase milk of mag to 65mL. Resolved.  17. Hemorrhoid: add witch hazel pads.   LOS: 10 days A FACE TO FACE EVALUATION WAS PERFORMED  Drema Pry Dawnette Mione 05/09/2021, 12:24 PM

## 2021-05-09 NOTE — Progress Notes (Signed)
Occupational Therapy Session Note  Patient Details  Name: Brooke Harrington MRN: [id number] Date of Birth: 14-Nov-1940  Today's Date: 05/09/2021 OT Individual Time: 3545-6256 & 3893-7342 OT Individual Time Calculation (min): 69 min & 47 min   Short Term Goals: Week 2:  OT Short Term Goal 1 (Week 2): STG = LTG due to ELOS  Skilled Therapeutic Interventions/Progress Updates:  Session 1 Skilled OT intervention completed with focus on d/c planning, discussion of rehab goals, POC, ADL retraining and activity tolerance, BUE strengthening. Pt received supine in bed, agreeable to session. Reported that family ed went well and pt is confident about tub/shower transfers. Pt completed bed mobility with mod I with bed flat, then LPN and MD in room for meds/rounds. Seated EOB, pt completed sponge bathing with set up A for bathing, set up A for UB dressing, supervision for LB dressing at the sit > stand level using RW. Education provided on c-sitting for donning LB at home on bed, due to pt expressing disliking getting dressed at the TTB, with pt demonstrating ability to donn socks with supervision with this method. Grooming completed with set up A. Seated EOB, pt participated in and was issued a HEP handout for BUE strengthening to promote endurance needed for self-care tasks and functional transfers at home including the following with orange theraband:  Shoulder horizontal abduction 2x10 Bicep flexion x10 each arm Shoulder flexion x10 each arm Chest presses 1x10  Multimodal cues needed for form and positioning throughout, with education provided on low vision strategy for following visual handout by having son or DIL audio record themselves stating the set up/directions until pt becomes familiar with exercises to ensure accuracy. Pt completed supervision ambulatory transfer with RW to recliner, with pt left seated with chair alarm on, and all needs in reach at end of session.  Session 2 Skilled OT  intervention completed with focus on kitchen management education, toilet transfers, ADL retraining, and activity tolerance. Pt received with NT in room with NT stating "she's walking to the bathroom, she walks well" with therapist noticing pt already in bathroom with door closed. Pt found standing at toilet with urine on floor, with pt unaware, with pt also stating she started pulling pants down while walking. Education provided to pt about safety with transfers, that even with urgency pt needs to keep pants up and have slight incontinent episode in brief vs floor to prevent spillage/fall risk. NT educated on supervision assist needed due to impaired cognition/vision. Pt completed all toileting and pericare with supervision. Pt completed short ambulatory transfer to w/c using RW with supervision, then transported to ADL kitchen with total A for energy conservation. Pt participated in the following in the kitchen: education regarding safe carrying strategies, setting up the environment to prevent falls, easily locating items by placing them in accessible spots, simple meal prep tips, and physical practice with transferring items and ambulating with RW. Pt able to complete ambulatory transfer throughout kitchen for >8 mins with supervision while using RW demonstrating increased endurance. Pt received on RA, and remained throughout session, asymptomatic. Pt transported back to room in w/c, with pt left seated in bed in long sitting, with bed alarm on, fresh beverage per request and all needs in reach at end of session.   Therapy Documentation Precautions:  Precautions Precautions: Fall Precaution Comments: 2L O2 via [place], droplet precautions, impaired vision due to cataracts Restrictions Weight Bearing Restrictions: No  Pain: Unrated pain in rectum 2/2 hemorrhoids- LPN in room and aware, as  well as MD providing witch hazel order for comfort   Therapy/Group: Individual Therapy  Allisson Schindel E Georgian Mcclory 05/09/2021,  7:31 AM

## 2021-05-10 MED ORDER — WITCH HAZEL-GLYCERIN EX PADS
MEDICATED_PAD | CUTANEOUS | 12 refills | Status: AC | PRN
Start: 2021-05-10 — End: ?

## 2021-05-10 NOTE — Progress Notes (Signed)
PROGRESS NOTE   Subjective/Complaints: On commode D/c home today [place] discharge med review No issues reported overnight  ROS:   Pt denies SOB, abd pain, CP, N/V/C/D, and vision changes, cough just a tickle now  Objective:   No results found. No results for input(s): WBC, HGB, HCT, PLT in the last 72 hours.  No results for input(s): NA, K, CL, CO2, GLUCOSE, BUN, CREATININE, CALCIUM in the last 72 hours.    Intake/Output Summary (Last 24 hours) at 05/10/2021 [id number] Last data filed at 05/10/2021 0719 Gross per 24 hour  Intake 456 ml  Output --  Net 456 ml     Pressure Injury 04/29/21 Buttocks Left Stage 2 -  Partial thickness loss of dermis presenting as a shallow open injury with a red, pink wound bed without slough. healing s1 blanchable as of 12/30 (Active)  04/29/21 1540  Location: Buttocks  Location Orientation: Left  Staging: Stage 2 -  Partial thickness loss of dermis presenting as a shallow open injury with a red, pink wound bed without slough.  Wound Description (Comments): healing s1 blanchable as of 12/30  Present on Admission: Yes     Pressure Injury 04/29/21 Buttocks Right Stage 2 -  Partial thickness loss of dermis presenting as a shallow open injury with a red, pink wound bed without slough. healing s2, blanchable as of 12/30 (Active)  04/29/21 1541  Location: Buttocks  Location Orientation: Right  Staging: Stage 2 -  Partial thickness loss of dermis presenting as a shallow open injury with a red, pink wound bed without slough.  Wound Description (Comments): healing s2, blanchable as of 12/30  Present on Admission: Yes    Physical Exam: Vital Signs Blood pressure (!) 167/56, pulse 76, temperature 98.7 F (37.1 C), resp. rate 20, height 5' (1.524 m), weight 75 kg, SpO2 94 %.  Gen: no distress, normal appearing HEENT: oral mucosa pink and moist, NCAT Cardio: Reg rate Chest: normal  effort, normal rate of breathing Abd: soft, non-distended Ext: no edema Psych: pleasant, normal affect Skin: intact  Musculoskeletal:     Right lower leg: Edema present.     Left lower leg: Edema present.  Skin:    General: Skin is warm. Groin very red/raw appearing B/L- but hard ot assess due to a lot of cream in place- buttocks similar- looks like MASD    Comments: Chronic changes in LE's, mild.   Neurological:     Comments: Patient is alert.  Impaired reasoning and recall. Sitting up in chair.  Oriented x3 and follows commands. Some delays in processing. Speech dysarthric/low volume. Able to provide biographical information. UE Motor 4/5. LE: 3/5 prox to 4/5 distally. No focal sensory abnl. No limb ataxia  Psychiatric:     Comments: More animated and expressive GU: on commode, blood per rectum   Assessment/Plan: 1. Functional deficits which require 3+ hours per day of interdisciplinary therapy in a comprehensive inpatient rehab setting. Physiatrist is providing close team supervision and 24 hour management of active medical problems listed below. Physiatrist and rehab team continue to assess barriers to discharge/monitor patient progress toward functional and medical goals  Care Tool:  Bathing  Body parts bathed by patient: Right arm, Left arm, Chest, Abdomen, Right upper leg, Left upper leg, Right lower leg, Left lower leg, Face, Front perineal area, Buttocks         Bathing assist Assist Level: Supervision/Verbal cueing     Upper Body Dressing/Undressing Upper body dressing   What is the patient wearing?: Pull over shirt    Upper body assist Assist Level: Set up assist    Lower Body Dressing/Undressing Lower body dressing      What is the patient wearing?: Pants, Incontinence brief     Lower body assist Assist for lower body dressing: Supervision/Verbal cueing     Toileting Toileting    Toileting assist Assist for toileting: Minimal Assistance - Patient >  75%     Transfers Chair/bed transfer  Transfers assist     Chair/bed transfer assist level: Supervision/Verbal cueing     Locomotion Ambulation   Ambulation assist      Assist level: Supervision/Verbal cueing Assistive device: Walker-rolling Max distance: 19ft   Walk 10 feet activity   Assist  Walk 10 feet activity did not occur: Safety/medical concerns  Assist level: Supervision/Verbal cueing Assistive device: Walker-rolling   Walk 50 feet activity   Assist Walk 50 feet with 2 turns activity did not occur: Safety/medical concerns  Assist level: Supervision/Verbal cueing Assistive device: Walker-rolling    Walk 150 feet activity   Assist Walk 150 feet activity did not occur: Safety/medical concerns  Assist level: Supervision/Verbal cueing Assistive device: Walker-rolling    Walk 10 feet on uneven surface  activity   Assist Walk 10 feet on uneven surfaces activity did not occur: Safety/medical concerns   Assist level: Supervision/Verbal cueing Assistive device: Walker-rolling   Wheelchair     Assist Is the patient using a wheelchair?: Yes Type of Wheelchair: Manual Wheelchair activity did not occur: N/A         Wheelchair 50 feet with 2 turns activity    Assist    Wheelchair 50 feet with 2 turns activity did not occur: N/A       Wheelchair 150 feet activity     Assist  Wheelchair 150 feet activity did not occur: N/A       Blood pressure (!) 167/56, pulse 76, temperature 98.7 F (37.1 C), resp. rate 20, height 5' (1.524 m), weight 75 kg, SpO2 94 %.  Medical Problem List and Plan: 1. Functional deficits. debility secondary to pneumonia/acute respiratory failure.  Pt also with resolving metabolic encephalopathy             -patient may shower             -ELOS/Goals: 7-10 days/ mod I for basic mobility and self-care  D/c home today  HFU scheduled 2.  Impaired mobility: d/c Lovenox upon discharge              -antiplatelet therapy: Aspirin 81 mg daily 3. Pain Management: Tylenol as needed 4. Mood: con't emotional support             -antipsychotic agents: N/A 5. Neuropsych: This patient is capable of making decisions on her own behalf. 6. Skin/Wound Care: Routine skin checks 7. Fluids/Electrolytes/Nutrition: encourage PO             -pt is edentulous. I asked her if she would like chopped food and she said yes--->ordered D3 diet to start 8.  SVT.  Patient did initially require IV adenosine successful cardioversion.  Continue Lopressor 50 mg twice daily. Cardiology services  follow-up             -monitor HR with activities 9.  Obesity BMI 32.29: provide dietary education  1/8- needs to have weight redone 10. Pneumonia: complete course of tamiflu and augmentin             -weaned O2             -IS, OOB  Start robitussin DM q4H prn.   D/c mucinex 11. Cognitive impairment: placed nursing order to request assistance with meals and meals out of bed in chair 12. Diarrhea: continue imodium.  13. MASD in groin/buttocks: continue nystatin cream.  14. Hypertension: creatinine stable. Increase lopressor to 62.5mg  BID.   1/8- BP elevated to 0000000 systolic this AM- will add Norvasc 2.5 mg daily 15. Impaired reasoning and recall: has reached baseline, d/c SLP 16. Constipation: continue milk of magnesium daily prn. Blood per rectum likely secondary to straining, increase milk of mag to 73mL. Resolved.  17. Hemorrhoid: add witch hazel pads.   LOS: 11 days A FACE TO FACE EVALUATION WAS PERFORMED  Clide Deutscher Jamill Wetmore 05/10/2021, 9:23 AM

## 2021-05-10 NOTE — Progress Notes (Signed)
Inpatient Rehabilitation Discharge Medication Review by a Pharmacist  A complete drug regimen review was completed for this patient to identify any potential clinically significant medication issues.  High Risk Drug Classes Is patient taking? Indication by Medication  Antipsychotic No   Anticoagulant No   Antibiotic No   Opioid No   Antiplatelet Yes ASA for CVA prophx  Hypoglycemics/insulin No   Vasoactive Medication Yes Norvasc for HTN, Metoprolol for SVR  Chemotherapy No   Other No      Clinically significant medication issues were identified that warrant physician communication and completion of prescribed/recommended actions by midnight of the next day:  No  Time spent performing this drug regimen review (minutes):  10 min  Brooke Harrington S. Alford Highland, PharmD, BCPS Clinical Staff Pharmacist [email or web address] [place] 05/10/2021 8:04 AM

## 2021-05-10 NOTE — Progress Notes (Signed)
Inpatient Rehabilitation Care Coordinator Discharge Note   Patient Details  Name: Brooke Harrington MRN: [id number] Date of Birth: 17-Feb-1941   Discharge location: Home  Length of Stay: 11 Days  Discharge activity level: Sup/Min  Home/community participation: pt son and daughter  Patient response [id number] Literacy - How often do you need to have someone help you when you read instructions, pamphlets, or other written material from your doctor or pharmacy?: Often  Patient response [id number] Isolation - How often do you feel lonely or isolated from those around you?: Never  Services provided included: SW, Pharmacy, TR, CM, RN, SLP, PT, OT, RD, MD  Financial Services:  Financial Services Utilized: Medicare    Choices offered to/list presented to: [name] [phone]  Follow-up services arranged:  [place]: [place]         Patient response to transportation need: Is the patient able to respond to transportation needs?: Yes In the past 12 months, has lack of transportation kept you from medical appointments or from getting medications?: No In the past 12 months, has lack of transportation kept you from meetings, work, or from getting things needed for daily living?: No    Comments (or additional information):  Patient/Family verbalized understanding of follow-up arrangements:  Yes  Individual responsible for coordination of the follow-up plan: [phone]  Confirmed correct DME delivered: Dyanne Iha 05/10/2021    Dyanne Iha

## 2021-05-11 ENCOUNTER — Encounter: Payer: Self-pay | Admitting: Physical Medicine and Rehabilitation

## 2021-05-13 LAB — CULTURE, BLOOD (ROUTINE X 2): Special Requests: ADEQUATE

## 2021-05-19 ENCOUNTER — Telehealth: Payer: Self-pay

## 2021-05-19 NOTE — Telephone Encounter (Signed)
Rosalita Chessman called back and said patient BP dropped down to 90/40. She is on Amlodipine 2.5 mg once day and Metoprolol BID. Rosalita Chessman is asking if patient needs to not take Metoprolol Tartrate 25 mg 2 1/2 tablets BID this evening if BP is still low?  Son-Todd [phone] is with patient.

## 2021-05-19 NOTE — Telephone Encounter (Signed)
Brooke Harrington from [facility] called stating patient BP today 108/56 and 92/48 while standing and got dizzy.

## 2021-05-20 NOTE — Telephone Encounter (Signed)
Notified son. Last night 108/57 so held the Metoprolol. This morning BP 139/87 so son gave her the Amlodipine and Metoprolol around 9:00 am. BP 106/68 at 10:40 am.

## 2021-05-26 DIAGNOSIS — K5900 Constipation, unspecified: Secondary | ICD-10-CM

## 2021-05-26 DIAGNOSIS — I471 Supraventricular tachycardia: Secondary | ICD-10-CM | POA: Diagnosis not present

## 2021-05-26 DIAGNOSIS — Z7982 Long term (current) use of aspirin: Secondary | ICD-10-CM

## 2021-05-26 DIAGNOSIS — E669 Obesity, unspecified: Secondary | ICD-10-CM

## 2021-05-26 DIAGNOSIS — G9341 Metabolic encephalopathy: Secondary | ICD-10-CM | POA: Diagnosis not present

## 2021-05-26 DIAGNOSIS — Z8709 Personal history of other diseases of the respiratory system: Secondary | ICD-10-CM

## 2021-05-26 DIAGNOSIS — I10 Essential (primary) hypertension: Secondary | ICD-10-CM

## 2021-05-26 DIAGNOSIS — J189 Pneumonia, unspecified organism: Secondary | ICD-10-CM | POA: Diagnosis not present

## 2021-05-26 DIAGNOSIS — J9600 Acute respiratory failure, unspecified whether with hypoxia or hypercapnia: Secondary | ICD-10-CM | POA: Diagnosis not present

## 2021-05-26 DIAGNOSIS — E785 Hyperlipidemia, unspecified: Secondary | ICD-10-CM

## 2021-05-26 DIAGNOSIS — Z9181 History of falling: Secondary | ICD-10-CM

## 2021-06-10 ENCOUNTER — Other Ambulatory Visit (HOSPITAL_COMMUNITY): Payer: Self-pay

## 2021-06-10 ENCOUNTER — Other Ambulatory Visit: Payer: Self-pay

## 2021-06-10 MED ORDER — METOPROLOL TARTRATE 25 MG PO TABS: 62.5000 mg | ORAL_TABLET | Freq: Two times a day (BID) | ORAL | 0 refills | Status: AC

## 2021-06-13 ENCOUNTER — Encounter
Payer: Medicare Other | Attending: Physical Medicine and Rehabilitation | Admitting: Physical Medicine and Rehabilitation

## 2021-07-07 ENCOUNTER — Other Ambulatory Visit: Payer: Self-pay | Admitting: Physical Medicine and Rehabilitation

## 2021-07-15 ENCOUNTER — Other Ambulatory Visit: Payer: Self-pay | Admitting: Physical Medicine and Rehabilitation

## 2021-07-23 ENCOUNTER — Other Ambulatory Visit: Payer: Self-pay | Admitting: Physical Medicine and Rehabilitation

## 2021-08-28 ENCOUNTER — Other Ambulatory Visit: Payer: Self-pay | Admitting: Physical Medicine and Rehabilitation
